# Patient Record
Sex: Male | Born: 1990 | Marital: Single | Smoking: Never smoker
Health system: Southern US, Community
[De-identification: ages and names within clinical notes are randomized; demographics above are authoritative.]

## PROBLEM LIST (undated history)

## (undated) DIAGNOSIS — I471 Supraventricular tachycardia, unspecified: Secondary | ICD-10-CM

## (undated) DIAGNOSIS — R000 Tachycardia, unspecified: Secondary | ICD-10-CM

## (undated) DIAGNOSIS — Q245 Malformation of coronary vessels: Secondary | ICD-10-CM

## (undated) HISTORY — DX: Malformation of coronary vessels: Q24.5

## (undated) HISTORY — PX: NO PAST SURGERIES: SHX2092

---

## 2011-06-28 ENCOUNTER — Ambulatory Visit (INDEPENDENT_AMBULATORY_CARE_PROVIDER_SITE_OTHER): Payer: 59 | Admitting: Family Medicine

## 2011-06-28 DIAGNOSIS — R079 Chest pain, unspecified: Secondary | ICD-10-CM

## 2011-06-28 DIAGNOSIS — M79609 Pain in unspecified limb: Secondary | ICD-10-CM

## 2011-06-28 LAB — POCT CBC
Granulocyte percent: 65.9 %G (ref 37–80)
HCT, POC: 46.4 % (ref 43.5–53.7)
Hemoglobin: 15.4 g/dL (ref 14.1–18.1)
MPV: 9.7 fL (ref 0–99.8)
POC Granulocyte: 4.8 (ref 2–6.9)

## 2011-06-28 MED ORDER — METAXALONE 800 MG PO TABS: ORAL_TABLET | ORAL | Status: DC

## 2011-06-28 MED ORDER — NAPROXEN 500 MG PO TABS: ORAL_TABLET | ORAL | Status: DC

## 2011-06-28 NOTE — Progress Notes (Signed)
  Subjective:    Patient ID: Blake Ortiz, male    DOB: 1990/05/19, 21 y.o.   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  No N/V/D.  He is concerned about his heart.  There is no family history of early cardiac events.  Both of his parents are healthy.  He denies any SOB.  The pain has been constant and worse with movement. He goes to the gym and doesn't have problems with his breathing or pain then.  No recent dental cleaning. Also hurts in L arm.  No paresthesias.  Review of Systems  All other systems reviewed and are negative.      Objective:   Physical Exam  Nursing note and vitals reviewed. Constitutional: He is oriented to person, place, and time. He appears well-developed and well-nourished. No distress.  HENT:  Head: Normocephalic and atraumatic.  Mouth/Throat: Oropharynx is clear and moist. No oropharyngeal exudate.  Neck: Normal range of motion. Neck supple. No thyromegaly present.  Cardiovascular: Normal rate, regular rhythm, normal heart sounds and intact distal pulses.  Exam reveals no gallop and no friction rub.   No murmur heard. Pulmonary/Chest: Effort normal and breath sounds normal. No respiratory distress. He has no wheezes. He has no rales. He exhibits no tenderness.  Abdominal: Soft. Bowel sounds are normal.  Musculoskeletal: Normal range of motion. He exhibits tenderness.       Right shoulder: Normal.       Arms: Lymphadenopathy:    He has no cervical adenopathy.  Neurological: He is alert and oriented to person, place, and time.  Skin: Skin is warm and dry. He is not diaphoretic.  Psychiatric: He has a normal mood and affect. His behavior is normal.   Results for orders placed in visit on 06/28/11  POCT CBC      Component Value Range   WBC 7.3  4.6 - 10.2 (K/uL)   Lymph, poc 2.0  0.6 - 3.4    POC LYMPH PERCENT 26.9  10 - 50 (%L)   MID (cbc) 0.5  0 - 0.9    POC MID % 7.2  0  - 12 (%M)   POC Granulocyte 4.8  2 - 6.9    Granulocyte percent 65.9  37 - 80 (%G)   RBC 5.37  4.69 - 6.13 (M/uL)   Hemoglobin 15.4  14.1 - 18.1 (g/dL)   HCT, POC 40.9  81.1 - 53.7 (%)   MCV 86.4  80 - 97 (fL)   MCH, POC 28.7  27 - 31.2 (pg)   MCHC 33.2  31.8 - 35.4 (g/dL)   RDW, POC 91.4     Platelet Count, POC 250  142 - 424 (K/uL)   MPV 9.7  0 - 99.8 (fL)     EKG: No acute problems.  Read by Dr. Patsy Lager     Assessment & Plan:  Chest pain-musculoskeletal etiology CP warnings to ER

## 2011-06-29 LAB — TSH: TSH: 0.793 u[IU]/mL (ref 0.350–4.500)

## 2016-08-13 ENCOUNTER — Emergency Department (HOSPITAL_COMMUNITY)
Admission: EM | Admit: 2016-08-13 | Discharge: 2016-08-13 | Discharge status: Against Medical Advice | Payer: BLUE CROSS/BLUE SHIELD

## 2019-06-24 ENCOUNTER — Observation Stay (HOSPITAL_COMMUNITY)
Admission: EM | Admit: 2019-06-24 | Discharge: 2019-06-25 | Disposition: A | Discharge status: Home / Self Care | Payer: Managed Care, Other (non HMO) | Attending: Cardiology | Admitting: Cardiology

## 2019-06-24 ENCOUNTER — Observation Stay (HOSPITAL_BASED_OUTPATIENT_CLINIC_OR_DEPARTMENT_OTHER): Payer: Managed Care, Other (non HMO)

## 2019-06-24 ENCOUNTER — Encounter (HOSPITAL_COMMUNITY): Payer: Self-pay | Admitting: *Deleted

## 2019-06-24 ENCOUNTER — Emergency Department (HOSPITAL_COMMUNITY): Payer: Managed Care, Other (non HMO)

## 2019-06-24 ENCOUNTER — Other Ambulatory Visit: Payer: Self-pay

## 2019-06-24 DIAGNOSIS — R7989 Other specified abnormal findings of blood chemistry: Secondary | ICD-10-CM

## 2019-06-24 DIAGNOSIS — R002 Palpitations: Secondary | ICD-10-CM

## 2019-06-24 DIAGNOSIS — Z791 Long term (current) use of non-steroidal anti-inflammatories (NSAID): Secondary | ICD-10-CM | POA: Diagnosis not present

## 2019-06-24 DIAGNOSIS — R000 Tachycardia, unspecified: Secondary | ICD-10-CM | POA: Insufficient documentation

## 2019-06-24 DIAGNOSIS — R778 Other specified abnormalities of plasma proteins: Secondary | ICD-10-CM

## 2019-06-24 DIAGNOSIS — Z7982 Long term (current) use of aspirin: Secondary | ICD-10-CM | POA: Diagnosis not present

## 2019-06-24 DIAGNOSIS — R079 Chest pain, unspecified: Secondary | ICD-10-CM | POA: Diagnosis not present

## 2019-06-24 DIAGNOSIS — R0789 Other chest pain: Secondary | ICD-10-CM | POA: Diagnosis present

## 2019-06-24 DIAGNOSIS — Z79899 Other long term (current) drug therapy: Secondary | ICD-10-CM | POA: Diagnosis not present

## 2019-06-24 DIAGNOSIS — Z20822 Contact with and (suspected) exposure to covid-19: Secondary | ICD-10-CM | POA: Insufficient documentation

## 2019-06-24 HISTORY — DX: Tachycardia, unspecified: R00.0

## 2019-06-24 LAB — ECHOCARDIOGRAM COMPLETE
Height: 66 in
Weight: 2560 oz

## 2019-06-24 LAB — CBC
HCT: 47.3 % (ref 39.0–52.0)
Hemoglobin: 15.6 g/dL (ref 13.0–17.0)
MCH: 27.8 pg (ref 26.0–34.0)
MCHC: 33 g/dL (ref 30.0–36.0)
MCV: 84.3 fL (ref 80.0–100.0)
Platelets: 278 10*3/uL (ref 150–400)
RBC: 5.61 MIL/uL (ref 4.22–5.81)
RDW: 12.4 % (ref 11.5–15.5)
WBC: 13.3 10*3/uL — ABNORMAL HIGH (ref 4.0–10.5)
nRBC: 0 % (ref 0.0–0.2)

## 2019-06-24 LAB — BASIC METABOLIC PANEL
Anion gap: 8 (ref 5–15)
BUN: 19 mg/dL (ref 6–20)
CO2: 25 mmol/L (ref 22–32)
Calcium: 8.8 mg/dL — ABNORMAL LOW (ref 8.9–10.3)
Chloride: 102 mmol/L (ref 98–111)
Creatinine, Ser: 1.14 mg/dL (ref 0.61–1.24)
GFR calc Af Amer: >60 mL/min (ref >60)
GFR calc non Af Amer: >60 mL/min (ref >60)
Glucose, Bld: 113 mg/dL — ABNORMAL HIGH (ref 70–99)
Potassium: 3.7 mmol/L (ref 3.5–5.1)
Sodium: 135 mmol/L (ref 135–145)

## 2019-06-24 LAB — RAPID URINE DRUG SCREEN, HOSP PERFORMED
Amphetamines: NOT DETECTED
Barbiturates: NOT DETECTED
Benzodiazepines: NOT DETECTED
Cocaine: NOT DETECTED
Opiates: NOT DETECTED
Tetrahydrocannabinol: NOT DETECTED

## 2019-06-24 LAB — RESPIRATORY PANEL BY RT PCR (FLU A&B, COVID)
Influenza A by PCR: NEGATIVE
Influenza B by PCR: NEGATIVE
SARS Coronavirus 2 by RT PCR: NEGATIVE

## 2019-06-24 LAB — TROPONIN I (HIGH SENSITIVITY)
Troponin I (High Sensitivity): 349 ng/L (ref <18)
Troponin I (High Sensitivity): 508 ng/L (ref <18)

## 2019-06-24 LAB — HEPARIN LEVEL (UNFRACTIONATED): Heparin Unfractionated: 0.15 IU/mL — ABNORMAL LOW (ref 0.30–0.70)

## 2019-06-24 LAB — D-DIMER, QUANTITATIVE: D-Dimer, Quant: 0.32 ug/mL-FEU (ref 0.00–0.50)

## 2019-06-24 MED ORDER — ONDANSETRON HCL 4 MG/2ML IJ SOLN: 4.0000 mg | Freq: Four times a day (QID) | INTRAMUSCULAR | Status: DC | PRN

## 2019-06-24 MED ORDER — NITROGLYCERIN 0.4 MG SL SUBL: 0.4000 mg | SUBLINGUAL_TABLET | SUBLINGUAL | Status: DC | PRN

## 2019-06-24 MED ORDER — ACETAMINOPHEN 325 MG PO TABS: 650.0000 mg | ORAL_TABLET | ORAL | Status: DC | PRN

## 2019-06-24 MED ORDER — ATORVASTATIN CALCIUM 40 MG PO TABS
40.0000 mg | ORAL_TABLET | Freq: Every day | ORAL | Status: DC
  Administered 2019-06-24 – 2019-06-25 (×2): 40 mg via ORAL
  Filled 2019-06-24 (×2): qty 1

## 2019-06-24 MED ORDER — HEPARIN (PORCINE) 25000 UT/250ML-% IV SOLN
1150.0000 [IU]/h | INTRAVENOUS | Status: DC
  Administered 2019-06-24: 850 [IU]/h via INTRAVENOUS
  Administered 2019-06-25: 08:00:00 1150 [IU]/h via INTRAVENOUS
  Filled 2019-06-24 (×2): qty 250

## 2019-06-24 MED ORDER — ALPRAZOLAM 0.25 MG PO TABS: 0.2500 mg | ORAL_TABLET | Freq: Two times a day (BID) | ORAL | Status: DC | PRN

## 2019-06-24 MED ORDER — ZOLPIDEM TARTRATE 5 MG PO TABS: 5.0000 mg | ORAL_TABLET | Freq: Every evening | ORAL | Status: DC | PRN

## 2019-06-24 MED ORDER — METOPROLOL TARTRATE 25 MG PO TABS
25.0000 mg | ORAL_TABLET | Freq: Two times a day (BID) | ORAL | Status: DC
  Filled 2019-06-24: qty 1

## 2019-06-24 MED ORDER — HEPARIN BOLUS VIA INFUSION
2000.0000 [IU] | Freq: Once | INTRAVENOUS | Status: AC
  Administered 2019-06-24: 2000 [IU] via INTRAVENOUS
  Filled 2019-06-24: qty 2000

## 2019-06-24 MED ORDER — METOPROLOL TARTRATE 50 MG PO TABS
50.0000 mg | ORAL_TABLET | Freq: Two times a day (BID) | ORAL | Status: DC
  Administered 2019-06-24: 50 mg via ORAL
  Filled 2019-06-24 (×2): qty 1

## 2019-06-24 MED ORDER — SODIUM CHLORIDE 0.9% FLUSH
3.0000 mL | Freq: Once | INTRAVENOUS | Status: AC
  Administered 2019-06-24: 11:00:00 3 mL via INTRAVENOUS

## 2019-06-24 MED ORDER — SODIUM CHLORIDE 0.9 % IV SOLN: INTRAVENOUS | Status: DC

## 2019-06-24 MED ORDER — HEPARIN BOLUS VIA INFUSION
4000.0000 [IU] | Freq: Once | INTRAVENOUS | Status: AC
  Administered 2019-06-24: 14:00:00 4000 [IU] via INTRAVENOUS
  Filled 2019-06-24: qty 4000

## 2019-06-24 MED ORDER — ASPIRIN 81 MG PO CHEW
324.0000 mg | CHEWABLE_TABLET | Freq: Once | ORAL | Status: AC
  Administered 2019-06-24: 12:00:00 324 mg via ORAL
  Filled 2019-06-24: qty 4

## 2019-06-24 MED ORDER — ASPIRIN EC 81 MG PO TBEC
81.0000 mg | DELAYED_RELEASE_TABLET | Freq: Every day | ORAL | Status: DC
  Administered 2019-06-25: 11:00:00 81 mg via ORAL
  Filled 2019-06-24: qty 1

## 2019-06-24 NOTE — H&P (Addendum)
Cardiology Admission History and Physical:   Patient ID: [name] MRN: [id number]; DOB: [birth year]   Admission date: 06/24/2019  Primary Care Provider: Patient, No Pcp Per Primary Cardiologist: Rollene Rotunda, MD new  Primary Electrophysiologist:  None   Chief Complaint:  Chest pain  Patient Profile:   Blake Ortiz is a 29 y.o. male with no prior chest pain, no medical issues except for tachycardia that has occurred 2-3 times over last 2 years.    History of Present Illness:   Blake Ortiz with above hx presents with chest pain.  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  He was tired and when he reached down in shower his heart started racing.  It has happened before with bending.  So he rested and it lasted longer than it has in the past.  Usually about an hour and this time it did not resolve.  He finally thinks he dozed but then had significant chest pain.  Described as bad pressure. No radiation and no N or V.  He sat around thinking it would go away, he rubbed his chest with alcohol but no relief.  So came to ER this AM.  His mother is with him.    His HR had slowed prior to arrival. The racing HR usually lasts an hour but this time about 5 hours.  He has never had chest pain with it.  Uses rare ETOH, no tobacco, no drugs.  Job is very active and no chest pain with that.  No lightheadedness no syncope.  No pain currently.  No colds or fevers, no hx of COVID, no vaccine.   EKG:  The ECG that was done today was personally reviewed and demonstrates ST at 112 RA enlargement and no acute changes.  Na 135, K+ 3.7, BUN 19, Cr 1.14  Hs Troponin 349 and 508  WBC 13.3  Hgb 15.6, plts 278  covid neg     2 V CXR  IMPRESSION: Interstitial prominence, question bronchitis symptoms.   Past Medical History:  Diagnosis Date  . Tachycardia    2-3 times over last 2 years    Past Surgical History:  Procedure Laterality Date  . NO PAST SURGERIES        Medications Prior to Admission: Prior to Admission medications   Medication Sig Start Date End Date Taking? Authorizing Provider  metaxalone (SKELAXIN) 800 MG tablet 1 tab tid x 5days then prn muscle pain Patient not taking: Reported on 06/24/2019 06/28/11   Anders Simmonds, PA-C  naproxen (NAPROSYN) 500 MG tablet 1 tab bid X 5 days then prn pain Patient not taking: Reported on 06/24/2019 06/28/11   Anders Simmonds, PA-C     Allergies:   No Known Allergies  Social History:   Social History   Socioeconomic History  . Marital status: Single    Spouse name: Not on file  . Number of children: Not on file  . Years of education: Not on file  . Highest education level: Not on file  Occupational History  . Not on file  Tobacco Use  . Smoking status: Never Smoker  . Smokeless tobacco: Never Used  Substance and Sexual Activity  . Alcohol use: Yes    Alcohol/week: 1.0 standard drinks    Types: 1 Glasses of wine per week  . Drug use: Not on file  . Sexual activity: Not on file  Other Topics Concern  . Not on file  Social History Narrative  .  Not on file   Social Determinants of Health   Financial Resource Strain:   . Difficulty of Paying Living Expenses:   Food Insecurity:   . Worried About Charity fundraiser in the Last Year:   . Arboriculturist in the Last Year:   Transportation Needs:   . Film/video editor (Medical):   Marland Kitchen Lack of Transportation (Non-Medical):   Physical Activity:   . Days of Exercise per Week:   . Minutes of Exercise per Session:   Stress:   . Feeling of Stress :   Social Connections:   . Frequency of Communication with Friends and Family:   . Frequency of Social Gatherings with Friends and Family:   . Attends Religious Services:   . Active Member of Clubs or Organizations:   . Attends Archivist Meetings:   Marland Kitchen Marital Status:   Intimate Partner Violence:   . Fear of Current or Ex-Partner:   . Emotionally Abused:   Marland Kitchen Physically  Abused:   . Sexually Abused:     Family History:   The patient's family history includes Healthy in his father and mother.    ROS:  Please see the history of present illness.  General:no colds or fevers, no weight changes Skin:no rashes or ulcers HEENT:no blurred vision, no congestion CV:see HPI PUL:see HPI GI:no diarrhea constipation or melena, no indigestion GU:no hematuria, no dysuria MS:no joint pain, no claudication Neuro:no syncope, no lightheadedness Endo:no diabetes, no thyroid disease All other ROS reviewed and negative.     Physical Exam/Data:   Vitals:   06/24/19 1430 06/24/19 1445 06/24/19 1500 06/24/19 1515  BP: 105/65 98/63 110/66 103/61  Pulse: 91 83 91 79  Resp: 15 13 (!) 24 19  Temp:      TempSrc:      SpO2: 99% 99% 99% 99%  Weight:      Height:       No intake or output data in the 24 hours ending 06/24/19 1534 Last 3 Weights 06/24/2019 06/28/2011  Weight (lbs) 160 lb 134 lb 6.4 oz  Weight (kg) 72.576 kg 60.963 kg     Body mass index is 25.82 kg/m.  General:  Well nourished, well developed, in no acute distress HEENT: normal Lymph: no adenopathy Neck: no JVD Endocrine:  No thryomegaly Vascular: No carotid bruits; pedal pulses 2+ bilaterally  Cardiac:  normal S1, S2; RRR; no murmur gallup rub or click Lungs:  clear to auscultation bilaterally, no wheezing, rhonchi or rales  Abd: soft, nontender, no hepatomegaly  Ext: no lower ext edema Musculoskeletal:  No deformities, BUE and BLE strength normal and equal Skin: warm and dry  Neuro:  Alert and oriented X 3 MAE follows comands, no focal abnormalities noted Psych:  Normal affect      Relevant CV Studies: Echo pending  Laboratory Data:  High Sensitivity Troponin:   Recent Labs  Lab 06/24/19 0738 06/24/19 1042  TROPONINIHS 349* 508*      Chemistry Recent Labs  Lab 06/24/19 0738  NA 135  K 3.7  CL 102  CO2 25  GLUCOSE 113*  BUN 19  CREATININE 1.14  CALCIUM 8.8*  GFRNONAA >60   GFRAA >60  ANIONGAP 8    No results for input(s): PROT, ALBUMIN, AST, ALT, ALKPHOS, BILITOT in the last 168 hours. Hematology Recent Labs  Lab 06/24/19 0738  WBC 13.3*  RBC 5.61  HGB 15.6  HCT 47.3  MCV 84.3  MCH 27.8  MCHC 33.0  RDW 12.4  PLT 278   BNPNo results for input(s): BNP, PROBNP in the last 168 hours.  DDimer  Recent Labs  Lab 06/24/19 1042  DDIMER 0.32     Radiology/Studies:  DG Chest 2 View  Result Date: 06/24/2019 CLINICAL DATA:  Chest pain EXAM: CHEST - 2 VIEW COMPARISON:  None. FINDINGS: Interstitial coarsening and fissure thickening without definite Kerley line. No effusion or pneumothorax. Normal heart size and mediastinal contours. IMPRESSION: Interstitial prominence, question bronchitis symptoms. Electronically Signed   By: Marnee Spring M.D.   On: 06/24/2019 07:11       HEAR Score (for undifferentiated chest pain):     1  Assessment and Plan:   1. Chest pain with elevated troponin.  Pain occurred after a couple of hours of rapid HR.  He has had heart racing 2-3 times in last 2 years.  Never lasted this long and never with chest pain.   IV heparin was added with elevated troponin.   Echo is being done now - plan to admit to [facility] on tele to further monitor.  Possible cardiac CTA depending on echo.  Dr. Antoine Poche to see.  BB added and statin -he has rec'd ASA 324 mg.  pain resolved 2. Tachycardia - possible SVT - has had 2-3 episodes in last 2 years usually begin when he bends over.  Usually last an hour and resolve.  This time 5-6 hours.  Otherwise it felt the same until the chest pain began.   Severity of Illness: The appropriate patient status for this patient is OBSERVATION. Observation status is judged to be reasonable and necessary in order to provide the required intensity of service to ensure the patient's safety. The patient's presenting symptoms, physical exam findings, and initial radiographic and laboratory data in the context of their  medical condition is felt to place them at decreased risk for further clinical deterioration. Furthermore, it is anticipated that the patient will be medically stable for discharge from the hospital within 2 midnights of admission. The following factors support the patient status of observation.   " The patient's presenting symptoms include chest pain worrisome for angina. " The physical exam findings include none, nor longer tachycardic. " The initial radiographic and laboratory data are abnormal with elevated troponin in healthy 29 year old.      For questions or updates, please contact [facility] Please consult [email or web address] for contact info under    Signed, Nada Boozer, NP  06/24/2019 3:34 PM   History and all data above reviewed.  Patient examined.  I agree with the findings as above.  The patient reports rare episodes of feeling like his heart is racing.  It happens when he bends over usually.  He cannot remember how many times but says that it is not frequent.  He feels something in his body that last for an hour or so and goes away relatively quickly.  It seems to be rapid onset.  He has a Scientist, physiological 3 which he thought might catch these episodes but it was happening so infrequently that he stopped wearing it. He does not have a device that can capture an EKG.  He said the event last night that happened at work and persisted for a while after work was the most intense episode with chest pain.  He did not have presyncope or syncope.  No acute SOB.  By the time he came to the ED it had resolved for the most part but his heart rate  was still elevated somewhat.  There were no acute EKG changes and he had sinus tach.  He did have positive enzymes.  The patient denies any new symptoms such as chest discomfort, neck or arm discomfort. There has been no new shortness of breath, PND or orthopnea. There have been no reported palpitations, presyncope or syncope.  The patient exam reveals COR:RRR  ,   Lungs: Clear  ,  Abd: Positive bowel sounds, no rebound no guarding, Ext No edema  .  All available labs, radiology testing, previous records reviewed. Agree with documented assessment and plan.   CHEST PAIN:  I suspect an arrhythmia.  Preliminary echo looks OK.  I will plan a coronary CTA.  If this is negative could be discharged.  He will get an Apple Watch that can record his EKG.  I would give him beta blocker pill in pocket.  We will teach him vagal maneuvers.  Check TSH.    Fayrene Fearing Artelia Game  4:19 PM  06/24/2019

## 2019-06-24 NOTE — ED Triage Notes (Signed)
Patient arrives reporting that last night before bed he started having palpitations, he has had this before, but usually only lasting about an hour, last night they persisted. Says he fell asleep and woke up with a pain in the center of his chest.

## 2019-06-24 NOTE — ED Provider Notes (Signed)
[facility]-EMERGENCY DEPT Provider Note   CSN: [id number] Arrival date & time: 06/24/19  6962     History Chief Complaint  Patient presents with  . Palpitations    Blake Ortiz is a 29 y.o. male presenting for evaluation of palpitations and chest pain.  Patient states he felt palpitations last night.  Around midnight, he developed a heavy chest pressure, which lasted for several hours before resolving.  During that time, pain was worse with inspiration.  He denies associated nausea, vomiting, diaphoresis.  At this time, patient is chest pain-free.  He reports similar events in the past, but none that have been this severe or lasted this long.  He currently does not have any palpitations.  He denies fevers, chills, cough, abdominal pain, urinary symptoms, abnormal bowel movements, leg pain or swelling.  He denies tobacco or drug use, reports intermittent alcohol use, 9 yesterday.  He has no medical problems, takes medications daily.  He denies history of hypertension, diabetes, family history of early cardiac death.  He denies recent travel, surgeries, immobilization, history of cancer, history of previous DVT/PE, or hormone use.  HPI     History reviewed. No pertinent past medical history.  Patient Active Problem List   Diagnosis Date Noted  . Chest pain at rest 06/24/2019    History reviewed. No pertinent surgical history.     No family history on file.  Social History   Tobacco Use  . Smoking status: Never Smoker  Substance Use Topics  . Alcohol use: Not on file  . Drug use: Not on file    Home Medications Prior to Admission medications   Medication Sig Start Date End Date Taking? Authorizing Provider  metaxalone (SKELAXIN) 800 MG tablet 1 tab tid x 5days then prn muscle pain Patient not taking: Reported on 06/24/2019 06/28/11   Anders Simmonds, PA-C  naproxen (NAPROSYN) 500 MG tablet 1 tab bid X 5 days then prn pain Patient not taking:  Reported on 06/24/2019 06/28/11   Anders Simmonds, PA-C    Allergies    Patient has no known allergies.  Review of Systems   Review of Systems  Cardiovascular: Positive for chest pain and palpitations.  All other systems reviewed and are negative.   Physical Exam Updated Vital Signs BP 117/77   Pulse 92   Temp 99.3 F (37.4 C) (Oral)   Resp 15   Ht 5\' 6"  (1.676 m)   Wt 72.6 kg   SpO2 99%   BMI 25.82 kg/m   Physical Exam Vitals and nursing note reviewed.  Constitutional:      General: He is not in acute distress.    Appearance: He is well-developed.     Comments: Appears nontoxic  HENT:     Head: Normocephalic and atraumatic.  Eyes:     Conjunctiva/sclera: Conjunctivae normal.     Pupils: Pupils are equal, round, and reactive to light.  Cardiovascular:     Rate and Rhythm: Normal rate and regular rhythm.     Pulses: Normal pulses.  Pulmonary:     Effort: Pulmonary effort is normal. No respiratory distress.     Breath sounds: Normal breath sounds. No wheezing.     Comments: Speaking in full sentences.  Clear lung sounds in all fields. Abdominal:     General: There is no distension.     Palpations: Abdomen is soft. There is no mass.     Tenderness: There is no abdominal tenderness. There is no  guarding or rebound.  Musculoskeletal:        General: Normal range of motion.     Cervical back: Normal range of motion and neck supple.     Comments: No leg pain or swelling  Skin:    General: Skin is warm and dry.     Capillary Refill: Capillary refill takes less than 2 seconds.  Neurological:     Mental Status: He is alert and oriented to person, place, and time.     ED Results / Procedures / Treatments   Labs (all labs ordered are listed, but only abnormal results are displayed) Labs Reviewed  BASIC METABOLIC PANEL - Abnormal; Notable for the following components:      Result Value   Glucose, Bld 113 (*)    Calcium 8.8 (*)    All other components within normal  limits  CBC - Abnormal; Notable for the following components:   WBC 13.3 (*)    All other components within normal limits  TROPONIN I (HIGH SENSITIVITY) - Abnormal; Notable for the following components:   Troponin I (High Sensitivity) 349 (*)    All other components within normal limits  TROPONIN I (HIGH SENSITIVITY) - Abnormal; Notable for the following components:   Troponin I (High Sensitivity) 508 (*)    All other components within normal limits  RESPIRATORY PANEL BY RT PCR (FLU A&B, COVID)  D-DIMER, QUANTITATIVE (NOT AT [facility])  RAPID URINE DRUG SCREEN, HOSP PERFORMED    EKG EKG Interpretation  Date/Time:  Wednesday June 24 2019 10:19:02 EDT Ventricular Rate:  94 PR Interval:    QRS Duration: 94 QT Interval:  357 QTC Calculation: 447 R Axis:   33 Text Interpretation: Sinus rhythm Borderline T wave abnormalities No acute changes No significant change since last tracing Confirmed by Varney Biles [phone]) on 06/24/2019 10:28:10 AM   Radiology DG Chest 2 View  Result Date: 06/24/2019 CLINICAL DATA:  Chest pain EXAM: CHEST - 2 VIEW COMPARISON:  None. FINDINGS: Interstitial coarsening and fissure thickening without definite Kerley line. No effusion or pneumothorax. Normal heart size and mediastinal contours. IMPRESSION: Interstitial prominence, question bronchitis symptoms. Electronically Signed   By: Monte Fantasia M.D.   On: 06/24/2019 07:11    Procedures .Critical Care Performed by: Franchot Heidelberg, PA-C Authorized by: Franchot Heidelberg, PA-C   Critical care provider statement:    Critical care time (minutes):  45   Critical care time was exclusive of:  Separately billable procedures and treating other patients and teaching time   Critical care was necessary to treat or prevent imminent or life-threatening deterioration of the following conditions:  Cardiac failure   Critical care was time spent personally by me on the following activities:  Blood draw for specimens,  development of treatment plan with patient or surrogate, discussions with consultants, evaluation of patient's response to treatment, examination of patient, obtaining history from patient or surrogate, ordering and performing treatments and interventions, ordering and review of laboratory studies, ordering and review of radiographic studies, pulse oximetry, re-evaluation of patient's condition and review of old charts   I assumed direction of critical care for this patient from another provider in my specialty: no   Comments:     Pt with elevated trops, concern for injury/ishcmeia requiring admission to the hospital for further evaluation/management   (including critical care time)  Medications Ordered in ED Medications  heparin bolus via infusion 4,000 Units (has no administration in time range)  heparin ADULT infusion 100 units/mL (25000 units/269mL sodium  chloride 0.45%) (has no administration in time range)  sodium chloride flush (NS) 0.9 % injection 3 mL (3 mLs Intravenous Given 06/24/19 1043)  aspirin chewable tablet 324 mg (324 mg Oral Given 06/24/19 1157)    ED Course  I have reviewed the triage vital signs and the nursing notes.  Pertinent labs & imaging results that were available during my care of the patient were reviewed by me and considered in my medical decision making (see chart for details).    MDM Rules/Calculators/A&P                      Patient presenting for evaluation of chest pain and palpitations, both of which have resolved.  On exam, patient appears nontoxic.  He has no risk factors for ACS or PE, however considering his tachycardia and symptoms, labs were obtained from triage including troponin.  Patient's labs were interpreted by me, troponin was elevated in the 300s.  Mild leukocytosis of 13.  Chest x-ray viewed interpreted by me, no pneumonia, no thorax and effusion.  Per radiology, there may be some mild bronchitic changes, though patient is without fever or  cough, low suspicion for acute bronchitis.  EKG with borderline T wave abnormalities, no significant change with repeat in the ED. Case discussed with attending, Dr. Rhunette Croft agrees to plan.   D-dimer negative, as such, doubt PE.  Repeat Trop elevated at 500, delta of 150.  Will consult with cardiology, pt will likely need to be admitted.   Discussed with Dr. Antoine Poche from cardiology who recommends admission to [facility] for cardiac CTA. Requesting UDS to r/o cocaine use.   Final Clinical Impression(s) / ED Diagnoses Final diagnoses:  Elevated troponin  Chest pressure  Palpitations    Rx / DC Orders ED Discharge Orders    None       Alveria Apley, PA-C 06/24/19 1320    Derwood Kaplan, MD 06/24/19 1410

## 2019-06-24 NOTE — Progress Notes (Signed)
ANTICOAGULATION CONSULT NOTE - Initial Consult  Pharmacy Consult for IV heparin Indication: chest pain/ACS  No Known Allergies  Patient Measurements: Height: 5\' 6"  (167.6 cm) Weight: 72.6 kg (160 lb) IBW/kg (Calculated) : 63.8 Heparin Dosing Weight: 72.6 kg   Vital Signs: Temp: 99.3 F (37.4 C) (04/28 0636) Temp Source: Oral (04/28 0636) BP: 109/72 (04/28 1230) Pulse Rate: 104 (04/28 1300)  Labs: Recent Labs    06/24/19 0738 06/24/19 1042  HGB 15.6  --   HCT 47.3  --   PLT 278  --   CREATININE 1.14  --   TROPONINIHS 349* 508*    Estimated Creatinine Clearance: 87.8 mL/min (by C-G formula based on SCr of 1.14 mg/dL).   Medical History: History reviewed. No pertinent past medical history.  Medications:  Scheduled:    Assessment: Pharmacy is consulted to dose heparin in 29 yo male diagnosed with ACS. Pt presented for evaluation of palpitations and chest pain. No noted anticoagulation or antiplatelet meds on med rec list.    Today, 06/24/19  Hgb 15.6, plt 278  SCr 1.13 mg/dl, CrCl > 06/26/19 ml/min   Goal of Therapy:  Heparin level 0.3-0.7 units/ml Monitor platelets by anticoagulation protocol: Yes   Plan:   Heparin 4000 units, then 850 units/hr  Obtain HL 6 hours after start of infusion  Daily CBC and HL while on heparin  Monitor for signs and symptoms of bleeding  785, PharmD, BCPS 06/24/2019 2:09 PM

## 2019-06-24 NOTE — ED Notes (Signed)
Transportation arranged with [facility].  ?

## 2019-06-24 NOTE — Progress Notes (Signed)
Echocardiogram 2D Echocardiogram has been performed.  Blake Ortiz Blake Ortiz 06/24/2019, 3:45 PM

## 2019-06-24 NOTE — ED Notes (Signed)
I sent a urine culture to the main lab 

## 2019-06-25 ENCOUNTER — Observation Stay (HOSPITAL_COMMUNITY): Payer: Managed Care, Other (non HMO)

## 2019-06-25 DIAGNOSIS — R778 Other specified abnormalities of plasma proteins: Secondary | ICD-10-CM

## 2019-06-25 DIAGNOSIS — R079 Chest pain, unspecified: Secondary | ICD-10-CM | POA: Diagnosis not present

## 2019-06-25 LAB — CBC
HCT: 44.1 % (ref 39.0–52.0)
Hemoglobin: 14.7 g/dL (ref 13.0–17.0)
MCH: 28.3 pg (ref 26.0–34.0)
MCHC: 33.3 g/dL (ref 30.0–36.0)
MCV: 84.8 fL (ref 80.0–100.0)
Platelets: 233 10*3/uL (ref 150–400)
RBC: 5.2 MIL/uL (ref 4.22–5.81)
RDW: 12.7 % (ref 11.5–15.5)
WBC: 6.5 10*3/uL (ref 4.0–10.5)
nRBC: 0 % (ref 0.0–0.2)

## 2019-06-25 LAB — T4, FREE: Free T4: 0.91 ng/dL (ref 0.61–1.12)

## 2019-06-25 LAB — BASIC METABOLIC PANEL
Anion gap: 8 (ref 5–15)
BUN: 13 mg/dL (ref 6–20)
CO2: 27 mmol/L (ref 22–32)
Calcium: 8.9 mg/dL (ref 8.9–10.3)
Chloride: 104 mmol/L (ref 98–111)
Creatinine, Ser: 1.05 mg/dL (ref 0.61–1.24)
GFR calc Af Amer: >60 mL/min (ref >60)
GFR calc non Af Amer: >60 mL/min (ref >60)
Glucose, Bld: 102 mg/dL — ABNORMAL HIGH (ref 70–99)
Potassium: 3.6 mmol/L (ref 3.5–5.1)
Sodium: 139 mmol/L (ref 135–145)

## 2019-06-25 LAB — HEPATIC FUNCTION PANEL
ALT: 32 U/L (ref 0–44)
AST: 24 U/L (ref 15–41)
Albumin: 3.3 g/dL — ABNORMAL LOW (ref 3.5–5.0)
Alkaline Phosphatase: 49 U/L (ref 38–126)
Bilirubin, Direct: 0.1 mg/dL (ref 0.0–0.2)
Indirect Bilirubin: 0.6 mg/dL (ref 0.3–0.9)
Total Bilirubin: 0.7 mg/dL (ref 0.3–1.2)
Total Protein: 6.8 g/dL (ref 6.5–8.1)

## 2019-06-25 LAB — HIV ANTIBODY (ROUTINE TESTING W REFLEX): HIV Screen 4th Generation wRfx: NONREACTIVE

## 2019-06-25 LAB — HEPARIN LEVEL (UNFRACTIONATED)
Heparin Unfractionated: 0.28 IU/mL — ABNORMAL LOW (ref 0.30–0.70)
Heparin Unfractionated: <0.1 IU/mL — ABNORMAL LOW (ref 0.30–0.70)

## 2019-06-25 LAB — LIPID PANEL
Cholesterol: 161 mg/dL (ref 0–200)
HDL: 61 mg/dL (ref >40)
LDL Cholesterol: 90 mg/dL (ref 0–99)
Total CHOL/HDL Ratio: 2.6 RATIO
Triglycerides: 52 mg/dL (ref <150)
VLDL: 10 mg/dL (ref 0–40)

## 2019-06-25 LAB — MAGNESIUM: Magnesium: 2.1 mg/dL (ref 1.7–2.4)

## 2019-06-25 LAB — HEMOGLOBIN A1C
Hgb A1c MFr Bld: 5.4 % (ref 4.8–5.6)
Mean Plasma Glucose: 108.28 mg/dL

## 2019-06-25 LAB — TSH: TSH: 0.629 u[IU]/mL (ref 0.350–4.500)

## 2019-06-25 MED ORDER — NITROGLYCERIN 0.4 MG SL SUBL
SUBLINGUAL_TABLET | SUBLINGUAL | Status: AC
  Filled 2019-06-25: qty 2

## 2019-06-25 MED ORDER — METOPROLOL SUCCINATE ER 25 MG PO TB24
25.0000 mg | ORAL_TABLET | Freq: Every day | ORAL | 0 refills | Status: DC
Start: 2019-06-25 — End: 2019-07-17

## 2019-06-25 MED ORDER — IOHEXOL 350 MG/ML SOLN
80.0000 mL | Freq: Once | INTRAVENOUS | Status: AC | PRN
  Administered 2019-06-25: 10:00:00 80 mL via INTRAVENOUS

## 2019-06-25 NOTE — Discharge Summary (Signed)
Discharge Summary    Patient ID: Blake Ortiz,  MRN: [id number], DOB/AGE: 09/21/1990 29 y.o.  Admit date: 06/24/2019 Discharge date: 06/25/2019  Primary Care Provider: Patient, No Pcp Per Primary Cardiologist: Minus Breeding, MD  Discharge Diagnoses    Active Problems:   Chest pain at rest  Allergies No Known Allergies  Diagnostic Studies/Procedures    Echo: 06/24/19  IMPRESSIONS    1. Left ventricular ejection fraction, by estimation, is 65 to 70%. The  left ventricle has normal function. The left ventricle has no regional  wall motion abnormalities. Left ventricular diastolic parameters were  normal.  2. Right ventricular systolic function is normal. The right ventricular  size is normal. Tricuspid regurgitation signal is inadequate for assessing  PA pressure.  3. The mitral valve is normal in structure. No evidence of mitral valve  regurgitation. No evidence of mitral stenosis.  4. The aortic valve is normal in structure. Aortic valve regurgitation is  not visualized. No aortic stenosis is present.  5. The inferior vena cava is normal in size with greater than 50%  respiratory variability, suggesting right atrial pressure of 3 mmHg.   Coronary CT: 06/25/19  FINDINGS: A 100 kV prospective scan was triggered in the descending thoracic aorta at 111 HU's. Axial non-contrast 3 mm slices were carried out through the heart. The data set was analyzed on a dedicated work station and scored using the [place]. Gantry rotation speed was 250 msecs and collimation was .6 mm. Metoprolol 50 mg PO and 0.8 mg of sl NTG was given. The 3D data set was reconstructed in 5% intervals of the 67-82 % of the R-R cycle. Diastolic phases were analyzed on a dedicated work station using MPR, MIP and VRT modes. The patient received 80 cc of contrast. Aorta:  Normal size.  No calcifications.  No dissection. Aortic Valve:  Trileaflet.  No calcifications. Coronary Arteries:   Normal coronary origin.  Left dominance. Left main is a large artery that gives rise to LAD and LCX arteries. Left main has no plaque. LAD is a large vessel that gives rise to a large diagonal artery and wraps around the apex. There is no plaque. 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 D1 has no plaque. LCX is a large dominant artery that gives rise to two OM branches and PDA. There is no plaque. RCA is a medium size non-dominant artery that has no plaque. Other findings:  Normal pulmonary vein drainage into the left atrium.  Normal left atrial appendage without a thrombus.  Normal size of the pulmonary artery.  IMPRESSION: 1. Coronary calcium score of 0. This was 0 percentile for age and sex matched control.  2. Normal coronary origin with right dominance.  3. CAD-RADS 0. No evidence of CAD (0%). 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 Consider therapy with beta-blockers.   Electronically Signed   By: Ena Dawley   On: 06/25/2019 11:34 _____________   History of Present Illness     Blake Ortiz is 29 yp male with no with significant PMH reported. He presented with chest pain.  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  He was tired and when he reached down in shower his heart started racing.  It has happened before with bending.  So he rested and it lasted longer than it had in the past.  Usually about an hour and this time it did not resolve. Described as bad pressure.  No radiation and no N or V.  He sat around thinking it would go away, he rubbed his chest with alcohol but no relief.  So came to ER the morning of admission.   His HR had slowed prior to arrival. The racing HR usually lasted an hour but this time was about 5 hours.  He had never had chest pain with it.  Uses rare ETOH, no tobacco, no drugs.  Job is very active and no chest pain with that.  No lightheadedness no syncope.  No pain  currently.  No colds or fevers, no hx of COVID, no vaccine.  EKG:  The ECG that was done while in the ED was personally reviewed and demonstrates ST at 112 RA enlargement and no acute changes.  Na 135, K+ 3.7, BUN 19, Cr 1.14  Hs Troponin 349 and 508 WBC 13.3  Hgb 15.6, plts 278  covid neg     2 V CXR  IMPRESSION: Interstitial prominence, question bronchitis symptoms.  He was admitted and placed on IV heparin with plans to undergo a coronary CT. BB was started on admission.   Hospital Course     Echocardiogram showed normal EF 70f 65-70% with no rWMA noted, normal RV and no valvular disease. hsTn peaked at 508. Suspect this is secondary to demand ischemia in the setting of tachycardia. Underwent coronary CT noted above with Ca++ score of 0, but did note pLAD with long intramyocardial bridge. He will be continued on BB therapy with Toprol XL 25mg  daily.    BODEN STUCKY was seen by Dr. Traci Sermon and determined stable for discharge home. Follow up in the office has been arranged. Medications are listed below.   _____________  Discharge Vitals Blood pressure 102/64, pulse 81, temperature 98.3 F (36.8 C), temperature source Oral, resp. rate 18, height 5\' 6"  (1.676 m), weight 80.3 kg, SpO2 99 %.  Filed Weights   06/24/19 1254 06/24/19 1613 06/25/19 0335  Weight: 72.6 kg 80.2 kg 80.3 kg    Labs & Radiologic Studies    CBC Recent Labs    06/24/19 0738 06/25/19 0328  WBC 13.3* 6.5  HGB 15.6 14.7  HCT 47.3 44.1  MCV 84.3 84.8  PLT 278 233   Basic Metabolic Panel Recent Labs    06/26/19 0738 06/25/19 0328  NA 135 139  K 3.7 3.6  CL 102 104  CO2 25 27  GLUCOSE 113* 102*  BUN 19 13  CREATININE 1.14 1.05  CALCIUM 8.8* 8.9  MG  --  2.1   Liver Function Tests Recent Labs    06/25/19 0328  AST 24  ALT 32  ALKPHOS 49  BILITOT 0.7  PROT 6.8  ALBUMIN 3.3*   No results for input(s): LIPASE, AMYLASE in the last 72 hours. Cardiac Enzymes No results for input(s):  CKTOTAL, CKMB, CKMBINDEX, TROPONINI in the last 72 hours. BNP Invalid input(s): POCBNP D-Dimer Recent Labs    06/24/19 1042  DDIMER 0.32   Hemoglobin A1C Recent Labs    06/25/19 0328  HGBA1C 5.4   Fasting Lipid Panel Recent Labs    06/25/19 0328  CHOL 161  HDL 61  LDLCALC 90  TRIG 52  CHOLHDL 2.6   Thyroid Function Tests Recent Labs    06/25/19 0328  TSH 0.629   _____________  DG Chest 2 View  Result Date: 06/24/2019 CLINICAL DATA:  Chest pain EXAM: CHEST - 2 VIEW COMPARISON:  None. FINDINGS: Interstitial coarsening and fissure thickening without definite Kerley line.  No effusion or pneumothorax. Normal heart size and mediastinal contours. IMPRESSION: Interstitial prominence, question bronchitis symptoms. Electronically Signed   By: Marnee SpringJonathon  Watts M.D.   On: 06/24/2019 07:11   CT CORONARY MORPH W/CTA COR W/SCORE W/CA W/CM &/OR WO/CM  Addendum Date: 06/25/2019   ADDENDUM REPORT: 06/25/2019 11:34 CLINICAL DATA:  29 year old male with chest pain and elevated troponin. EXAM: Cardiac/Coronary  CTA TECHNIQUE: The patient was scanned on a [employer] scanner. FINDINGS: A 100 kV prospective scan was triggered in the descending thoracic aorta at 111 HU's. Axial non-contrast 3 mm slices were carried out through the heart. The data set was analyzed on a dedicated work station and scored using the [place] method. Gantry rotation speed was 250 msecs and collimation was .6 mm. Metoprolol 50 mg PO and 0.8 mg of sl NTG was given. The 3D data set was reconstructed in 5% intervals of the 67-82 % of the R-R cycle. Diastolic phases were analyzed on a dedicated work station using MPR, MIP and VRT modes. The patient received 80 cc of contrast. Aorta:  Normal size.  No calcifications.  No dissection. Aortic Valve:  Trileaflet.  No calcifications. Coronary Arteries:  Normal coronary origin.  Left dominance. Left main is a large artery that gives rise to LAD and LCX arteries. Left main has no  plaque. LAD is a large vessel that gives rise to a large diagonal artery and wraps around the apex. There is no plaque. 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 D1 has no plaque. LCX is a large dominant artery that gives rise to two OM branches and PDA. There is no plaque. RCA is a medium size non-dominant artery that has no plaque. Other findings: Normal pulmonary vein drainage into the left atrium. Normal left atrial appendage without a thrombus. Normal size of the pulmonary artery. IMPRESSION: 1. Coronary calcium score of 0. This was 0 percentile for age and sex matched control. 2. Normal coronary origin with right dominance. 3. CAD-RADS 0. No evidence of CAD (0%). 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 Consider therapy with beta-blockers. Electronically Signed   By: Tobias AlexanderKatarina  Nelson   On: 06/25/2019 11:34   Result Date: 06/25/2019 EXAM: OVER-READ INTERPRETATION  CT CHEST The following report is an over-read performed by radiologist Dr. Charlett NoseKevin Dover [facility] on 06/25/2019. This over-read does not include interpretation of cardiac or coronary anatomy or pathology. The coronary CTA interpretation by the cardiologist is attached. COMPARISON:  None. FINDINGS: Vascular: Heart is normal size.  Aorta normal caliber. Mediastinum/Nodes: No adenopathy in the lower mediastinum or hila. Lungs/Pleura: No confluent opacities or effusions. Upper Abdomen: Suspect diffuse fatty infiltration of the liver. Musculoskeletal: Chest wall soft tissues are unremarkable. No acute bony abnormality. IMPRESSION: Suspect diffuse fatty infiltration of the liver. Electronically Signed: By: Charlett NoseKevin  Dover M.D. On: 06/25/2019 10:17   ECHOCARDIOGRAM COMPLETE  Result Date: 06/24/2019    ECHOCARDIOGRAM REPORT   Patient Name:   Traci SermonJOSE C Gasiorowski Date of Exam: 06/24/2019 Medical Rec #:  [id number]      Height:       66.0 in Accession #:    [id number]     Weight:       160.0 lb Date of Birth:  [birth year]     BSA:           1.819 m Patient Age:    28 years       BP:           110/66 mmHg Patient  Gender: M              HR:           79 bpm. Exam Location:  Inpatient Procedure: 2D Echo, Color Doppler and Cardiac Doppler Indications:    R07.9* Chest pain, unspecified  History:        Patient has no prior history of Echocardiogram examinations.  Sonographer:    Irving Burton Senior RDCS Referring Phys: [id number] LAURA R INGOLD IMPRESSIONS  1. Left ventricular ejection fraction, by estimation, is 65 to 70%. The left ventricle has normal function. The left ventricle has no regional wall motion abnormalities. Left ventricular diastolic parameters were normal.  2. Right ventricular systolic function is normal. The right ventricular size is normal. Tricuspid regurgitation signal is inadequate for assessing PA pressure.  3. The mitral valve is normal in structure. No evidence of mitral valve regurgitation. No evidence of mitral stenosis.  4. The aortic valve is normal in structure. Aortic valve regurgitation is not visualized. No aortic stenosis is present.  5. The inferior vena cava is normal in size with greater than 50% respiratory variability, suggesting right atrial pressure of 3 mmHg. FINDINGS  Left Ventricle: Left ventricular ejection fraction, by estimation, is 65 to 70%. The left ventricle has normal function. The left ventricle has no regional wall motion abnormalities. The left ventricular internal cavity size was normal in size. There is  no left ventricular hypertrophy. Left ventricular diastolic parameters were normal. Normal left ventricular filling pressure. Right Ventricle: The right ventricular size is normal. No increase in right ventricular wall thickness. Right ventricular systolic function is normal. Tricuspid regurgitation signal is inadequate for assessing PA pressure. Left Atrium: Left atrial size was normal in size. Right Atrium: Right atrial size was normal in size. Pericardium: There is no evidence of pericardial effusion. Mitral  Valve: The mitral valve is normal in structure. Normal mobility of the mitral valve leaflets. No evidence of mitral valve regurgitation. No evidence of mitral valve stenosis. Tricuspid Valve: The tricuspid valve is normal in structure. Tricuspid valve regurgitation is not demonstrated. No evidence of tricuspid stenosis. Aortic Valve: The aortic valve is normal in structure. Aortic valve regurgitation is not visualized. No aortic stenosis is present. Pulmonic Valve: The pulmonic valve was normal in structure. Pulmonic valve regurgitation is not visualized. No evidence of pulmonic stenosis. Aorta: The aortic root is normal in size and structure. Venous: The inferior vena cava is normal in size with greater than 50% respiratory variability, suggesting right atrial pressure of 3 mmHg. IAS/Shunts: No atrial level shunt detected by color flow Doppler.  LEFT VENTRICLE PLAX 2D LVIDd:         4.00 cm  Diastology LVIDs:         2.60 cm  LV e' lateral:   12.80 cm/s LV PW:         0.80 cm  LV E/e' lateral: 6.5 LV IVS:        0.90 cm  LV e' medial:    9.03 cm/s LVOT diam:     1.90 cm  LV E/e' medial:  9.2 LV SV:         66 LV SV Index:   36 LVOT Area:     2.84 cm  RIGHT VENTRICLE RV S prime:     11.70 cm/s TAPSE (M-mode): 2.3 cm LEFT ATRIUM             Index       RIGHT ATRIUM  Index LA diam:        3.00 cm 1.65 cm/m  RA Area:     15.70 cm LA Vol (A2C):   34.9 ml 19.19 ml/m RA Volume:   37.80 ml  20.78 ml/m LA Vol (A4C):   25.3 ml 13.91 ml/m LA Biplane Vol: 29.8 ml 16.38 ml/m  AORTIC VALVE LVOT Vmax:   110.00 cm/s LVOT Vmean:  78.800 cm/s LVOT VTI:    0.233 m  AORTA Ao Root diam: 3.10 cm MITRAL VALVE MV Area (PHT): 3.77 cm    SHUNTS MV Decel Time: 201 msec    Systemic VTI:  0.23 m MV E velocity: 83.10 cm/s  Systemic Diam: 1.90 cm MV A velocity: 70.30 cm/s MV E/A ratio:  1.18 Armanda Magic MD Electronically signed by Armanda Magic MD Signature Date/Time: 06/24/2019/4:04:40 PM    Final    Disposition   Pt is  being discharged home today in good condition.  Follow-up Plans & Appointments    Follow-up Information    Azalee Course, [place] Follow up on 07/06/2019.   Specialties: Cardiology, Radiology Why: at 2:10pm for your follow up appt.  Contact information: [address] [place] [address] [phone]          Discharge Instructions    Diet - low sodium heart healthy   Complete by: As directed    Increase activity slowly   Complete by: As directed      Discharge Medications     Medication List    STOP taking these medications   metaxalone 800 MG tablet Commonly known as: Skelaxin   naproxen 500 MG tablet Commonly known as: Naprosyn     TAKE these medications   metoprolol succinate 25 MG 24 hr tablet Commonly known as: Toprol XL Take 1 tablet (25 mg total) by mouth daily.        No                               Did the patient have a percutaneous coronary intervention (stent / angioplasty)?:  No.      Outstanding Labs/Studies   N/a  Duration of Discharge Encounter   Greater than 30 minutes including physician time.  Signed, Laverda Page NP-C 06/25/2019, 2:03 PM

## 2019-06-25 NOTE — Progress Notes (Signed)
ANTICOAGULATION CONSULT NOTE  Pharmacy Consult for IV heparin Indication: chest pain/ACS  No Known Allergies  Patient Measurements: Height: 5\' 6"  (167.6 cm) Weight: 80.3 kg (177 lb 1.6 oz) IBW/kg (Calculated) : 63.8 Heparin Dosing Weight: 72.6 kg   Vital Signs: Temp: 98.1 F (36.7 C) (04/29 0510) Temp Source: Oral (04/29 0510) BP: 102/65 (04/29 0510) Pulse Rate: 67 (04/29 0510)  Labs: Recent Labs    06/24/19 0738 06/24/19 1042 06/24/19 1937 06/25/19 0328  HGB 15.6  --   --  14.7  HCT 47.3  --   --  44.1  PLT 278  --   --  233  HEPARINUNFRC  --   --  0.15* 0.28*  CREATININE 1.14  --   --   --   TROPONINIHS 349* 508*  --   --     Estimated Creatinine Clearance: 96.9 mL/min (by C-G formula based on SCr of 1.14 mg/dL).   Assessment: Pharmacy is consulted to dose heparin in 29 yo male diagnosed with ACS. Pt presented for evaluation of palpitations and chest pain. No noted anticoagulation or antiplatelet meds on med rec list.    Heparin level 0.28 (slightly subtherapeutic) on gtt at 1000 units/hr. No issues with line or bleeding reported per RN.  Goal of Therapy:  Heparin level 0.3-0.7 units/ml Monitor platelets by anticoagulation protocol: Yes   Plan:   Increase heparin to 1150 units/hr  Will f/u 6 hr heparin level  34, PharmD, BCPS Please see amion for complete clinical pharmacist phone list 06/25/2019 5:18 AM

## 2019-06-25 NOTE — Progress Notes (Signed)
Patient was discharged home by MD order; discharged instructions reviewed and given to patient with care notes; IV DIC; skin intact; patient will be escorted to the car by float nurse via wheelchair.

## 2019-06-25 NOTE — Progress Notes (Signed)
Progress Note  Patient Name: Blake Ortiz Date of Encounter: 06/25/2019  Primary Cardiologist:   Rollene Rotunda, MD   Subjective   No complaints overnight.  No pain or SOB.   Inpatient Medications    Scheduled Meds: . aspirin EC  81 mg Oral Daily  . atorvastatin  40 mg Oral Daily  . metoprolol tartrate  50 mg Oral BID   Continuous Infusions: . sodium chloride 10 mL/hr at 06/24/19 2130  . heparin 1,150 Units/hr (06/25/19 0741)   PRN Meds: acetaminophen, ALPRAZolam, nitroGLYCERIN, ondansetron (ZOFRAN) IV, zolpidem   Vital Signs    Vitals:   06/24/19 2008 06/25/19 0036 06/25/19 0335 06/25/19 0510  BP: (!) 109/59 94/61  102/65  Pulse: 80 71  67  Resp: 19 18  17   Temp: 99.3 F (37.4 C) 98.4 F (36.9 C)  98.1 F (36.7 C)  TempSrc: Oral Oral  Oral  SpO2: 100% 100%  100%  Weight:   80.3 kg   Height:        Intake/Output Summary (Last 24 hours) at 06/25/2019 0807 Last data filed at 06/25/2019 0741 Gross per 24 hour  Intake 653.08 ml  Output 850 ml  Net -196.92 ml   Filed Weights   06/24/19 1254 06/24/19 1613 06/25/19 0335  Weight: 72.6 kg 80.2 kg 80.3 kg    Telemetry    NSR, sinus arrhythmia - Personally Reviewed  ECG    NA - Personally Reviewed  Physical Exam   GEN: No acute distress.   Neck: No  JVD Cardiac: RRR, no murmurs, rubs, or gallops.  Respiratory: Clear  to auscultation bilaterally. GI: Soft, nontender, non-distended  MS: No  edema; No deformity. Neuro:  Nonfocal  Psych: Normal affect   Labs    Chemistry Recent Labs  Lab 06/24/19 0738 06/25/19 0328  NA 135 139  K 3.7 3.6  CL 102 104  CO2 25 27  GLUCOSE 113* 102*  BUN 19 13  CREATININE 1.14 1.05  CALCIUM 8.8* 8.9  PROT  --  6.8  ALBUMIN  --  3.3*  AST  --  24  ALT  --  32  ALKPHOS  --  49  BILITOT  --  0.7  GFRNONAA >60 >60  GFRAA >60 >60  ANIONGAP 8 8     Hematology Recent Labs  Lab 06/24/19 0738 06/25/19 0328  WBC 13.3* 6.5  RBC 5.61 5.20  HGB 15.6 14.7   HCT 47.3 44.1  MCV 84.3 84.8  MCH 27.8 28.3  MCHC 33.0 33.3  RDW 12.4 12.7  PLT 278 233    Cardiac EnzymesNo results for input(s): TROPONINI in the last 168 hours. No results for input(s): TROPIPOC in the last 168 hours.   BNPNo results for input(s): BNP, PROBNP in the last 168 hours.   DDimer  Recent Labs  Lab 06/24/19 1042  DDIMER 0.32     Radiology    DG Chest 2 View  Result Date: 06/24/2019 CLINICAL DATA:  Chest pain EXAM: CHEST - 2 VIEW COMPARISON:  None. FINDINGS: Interstitial coarsening and fissure thickening without definite Kerley line. No effusion or pneumothorax. Normal heart size and mediastinal contours. IMPRESSION: Interstitial prominence, question bronchitis symptoms. Electronically Signed   By: 06/26/2019 M.D.   On: 06/24/2019 07:11   ECHOCARDIOGRAM COMPLETE  Result Date: 06/24/2019    ECHOCARDIOGRAM REPORT   Patient Name:   Blake Ortiz Date of Exam: 06/24/2019 Medical Rec #:  06/26/2019      Height:  66.0 in Accession #:    [id number]     Weight:       160.0 lb Date of Birth:  1990-12-09     BSA:          1.819 m Patient Age:    28 years       BP:           110/66 mmHg Patient Gender: M              HR:           79 bpm. Exam Location:  Inpatient Procedure: 2D Echo, Color Doppler and Cardiac Doppler Indications:    R07.9* Chest pain, unspecified  History:        Patient has no prior history of Echocardiogram examinations.  Sonographer:    Irving Burton Senior RDCS Referring Phys: [id number] LAURA R INGOLD IMPRESSIONS  1. Left ventricular ejection fraction, by estimation, is 65 to 70%. The left ventricle has normal function. The left ventricle has no regional wall motion abnormalities. Left ventricular diastolic parameters were normal.  2. Right ventricular systolic function is normal. The right ventricular size is normal. Tricuspid regurgitation signal is inadequate for assessing PA pressure.  3. The mitral valve is normal in structure. No evidence of mitral valve  regurgitation. No evidence of mitral stenosis.  4. The aortic valve is normal in structure. Aortic valve regurgitation is not visualized. No aortic stenosis is present.  5. The inferior vena cava is normal in size with greater than 50% respiratory variability, suggesting right atrial pressure of 3 mmHg. FINDINGS  Left Ventricle: Left ventricular ejection fraction, by estimation, is 65 to 70%. The left ventricle has normal function. The left ventricle has no regional wall motion abnormalities. The left ventricular internal cavity size was normal in size. There is  no left ventricular hypertrophy. Left ventricular diastolic parameters were normal. Normal left ventricular filling pressure. Right Ventricle: The right ventricular size is normal. No increase in right ventricular wall thickness. Right ventricular systolic function is normal. Tricuspid regurgitation signal is inadequate for assessing PA pressure. Left Atrium: Left atrial size was normal in size. Right Atrium: Right atrial size was normal in size. Pericardium: There is no evidence of pericardial effusion. Mitral Valve: The mitral valve is normal in structure. Normal mobility of the mitral valve leaflets. No evidence of mitral valve regurgitation. No evidence of mitral valve stenosis. Tricuspid Valve: The tricuspid valve is normal in structure. Tricuspid valve regurgitation is not demonstrated. No evidence of tricuspid stenosis. Aortic Valve: The aortic valve is normal in structure. Aortic valve regurgitation is not visualized. No aortic stenosis is present. Pulmonic Valve: The pulmonic valve was normal in structure. Pulmonic valve regurgitation is not visualized. No evidence of pulmonic stenosis. Aorta: The aortic root is normal in size and structure. Venous: The inferior vena cava is normal in size with greater than 50% respiratory variability, suggesting right atrial pressure of 3 mmHg. IAS/Shunts: No atrial level shunt detected by color flow Doppler.   LEFT VENTRICLE PLAX 2D LVIDd:         4.00 cm  Diastology LVIDs:         2.60 cm  LV e' lateral:   12.80 cm/s LV PW:         0.80 cm  LV E/e' lateral: 6.5 LV IVS:        0.90 cm  LV e' medial:    9.03 cm/s LVOT diam:     1.90 cm  LV E/e' medial:  9.2 LV SV:         66 LV SV Index:   36 LVOT Area:     2.84 cm  RIGHT VENTRICLE RV S prime:     11.70 cm/s TAPSE (M-mode): 2.3 cm LEFT ATRIUM             Index       RIGHT ATRIUM           Index LA diam:        3.00 cm 1.65 cm/m  RA Area:     15.70 cm LA Vol (A2C):   34.9 ml 19.19 ml/m RA Volume:   37.80 ml  20.78 ml/m LA Vol (A4C):   25.3 ml 13.91 ml/m LA Biplane Vol: 29.8 ml 16.38 ml/m  AORTIC VALVE LVOT Vmax:   110.00 cm/s LVOT Vmean:  78.800 cm/s LVOT VTI:    0.233 m  AORTA Ao Root diam: 3.10 cm MITRAL VALVE MV Area (PHT): 3.77 cm    SHUNTS MV Decel Time: 201 msec    Systemic VTI:  0.23 m MV E velocity: 83.10 cm/s  Systemic Diam: 1.90 cm MV A velocity: 70.30 cm/s MV E/A ratio:  1.18 Fransico Him MD Electronically signed by Fransico Him MD Signature Date/Time: 06/24/2019/4:04:40 PM    Final     Cardiac Studies   Echo  06/24/19 1. Left ventricular ejection fraction, by estimation, is 65 to 70%. The  left ventricle has normal function. The left ventricle has no regional  wall motion abnormalities. Left ventricular diastolic parameters were  normal.  2. Right ventricular systolic function is normal. The right ventricular  size is normal. Tricuspid regurgitation signal is inadequate for assessing  PA pressure.  3. The mitral valve is normal in structure. No evidence of mitral valve  regurgitation. No evidence of mitral stenosis.  4. The aortic valve is normal in structure. Aortic valve regurgitation is  not visualized. No aortic stenosis is present.  5. The inferior vena cava is normal in size with greater than 50%  respiratory variability, suggesting right atrial pressure of 3 mmHg.   Patient Profile     29 y.o. male with chest pain and  elevated troponin.  No prior cardiac history.   Assessment & Plan    ELEVATED TROPONIN:  Likely secondary to demand ischemia with possible tachycardia although I have no proof of this.   No arrhythmia overnight.  He has ordered an Visual merchandiser.  Home today if CT OK.  Follow up in our office me or APP in two months.    For questions or updates, please contact [facility] Please consult [email or web address] for contact info under Cardiology/STEMI.   Signed, Minus Breeding, MD  06/25/2019, 8:07 AM

## 2019-07-06 ENCOUNTER — Ambulatory Visit: Payer: Managed Care, Other (non HMO) | Admitting: Physician Assistant

## 2019-07-14 NOTE — Progress Notes (Signed)
Cardiology Office Note   Date:  07/17/2019   ID:  JONDAVID SCHREIER, DOB 01-25-1991, MRN [id number]  PCP:  Patient, No Pcp Per Cardiologist:  Rollene Rotunda, MD 06/25/2019 in-hospital Electrphysiologist: None Theodore Demark, PA-C   Chief Complaint  Patient presents with  . Follow-up  . Palpitations    Last week.    History of Present Illness: GILMAN OLAZABAL is a 29 y.o. male with a history of tachypalpitations was admitted 04/28-04/29/2021 with tachypalpitations, chest pain. Trop peak 508, LAD w/ myocardial bridge, but no CAD on CT, echo ok. BB added  DAILEN MCCLISH presents for cardiology follow up.  He had palpitations last Sat, they did not last that long. His HR got to 200, but it only lasted about 30". He quit taking the BB because he was not sure he was supposed to be on it long term.   He does want the short-acting Lopressor, to treat episodes as they occur.   It seems to happen when he bends down. That is true for the last 2 episodes, and maybe more.  Activity level is good, no chest pain, no sx w/ exertion.   The day he had chest pain, his HR had been very high for a long time, he also had a hard/long day at work.    Past Medical History:  Diagnosis Date  . Tachycardia    2-3 times over last 2 years    Past Surgical History:  Procedure Laterality Date  . NO PAST SURGERIES      Current Outpatient Medications  Medication Sig Dispense Refill  . metoprolol succinate (TOPROL XL) 25 MG 24 hr tablet Take 1 tablet (25 mg total) by mouth daily. (Patient not taking: Reported on 07/17/2019) 90 tablet 0   No current facility-administered medications for this visit.    Allergies:   Patient has no known allergies.    Social History:  The patient  reports that he has never smoked. He has never used smokeless tobacco. He reports current alcohol use of about 1.0 standard drinks of alcohol per week.   Family History:  The patient's family history includes Healthy in  his father and mother.  He indicated that his mother is alive. He indicated that his father is alive.    ROS:  Please see the history of present illness. All other systems are reviewed and negative.    PHYSICAL EXAM: VS:  BP 108/62 (BP Location: Right Arm, Patient Position: Sitting, Cuff Size: Normal)   Pulse 64   Temp (!) 97.5 F (36.4 C)   Ht 5' 5.5" (1.664 m)   Wt 183 lb (83 kg)   BMI 29.99 kg/m  , BMI Body mass index is 29.99 kg/m. GEN: Well nourished, well developed, male in no acute distress HEENT: normal for age  Neck: no JVD, no carotid bruit, no masses Cardiac: RRR; no murmur, no rubs, or gallops Respiratory:  clear to auscultation bilaterally, normal work of breathing GI: soft, nontender, nondistended, + BS MS: no deformity or atrophy; no edema; distal pulses are 2+ in all 4 extremities  Skin: warm and dry, no rash Neuro:  Strength and sensation are intact Psych: euthymic mood, full affect   EKG:  EKG is ordered today. The ekg ordered today demonstrates SR, HR 64, nl intervals, no ischemic changes  Echo: 06/24/19 IMPRESSIONS  1. Left ventricular ejection fraction, by estimation, is 65 to 70%. The  left ventricle has normal function. The left ventricle has no regional  wall motion abnormalities. Left ventricular diastolic parameters were  normal.  2. Right ventricular systolic function is normal. The right ventricular  size is normal. Tricuspid regurgitation signal is inadequate for assessing  PA pressure.  3. The mitral valve is normal in structure. No evidence of mitral valve  regurgitation. No evidence of mitral stenosis.  4. The aortic valve is normal in structure. Aortic valve regurgitation is  not visualized. No aortic stenosis is present.  5. The inferior vena cava is normal in size with greater than 50%  respiratory variability, suggesting right atrial pressure of 3 mmHg.   Coronary CT: 06/25/19 FINDINGS: Aorta: Normal size. No  calcifications. No dissection. Aortic Valve: Trileaflet. No calcifications. Coronary Arteries: Normal coronary origin. Left dominance. Left main is a large artery that gives rise to LAD and LCX arteries. Left main has no plaque. LAD is a large vessel that gives rise to a large diagonal artery and wraps around the apex. There is no plaque. 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 D1 has no plaque. LCX is a large dominant artery that gives rise to two OM branches and PDA. There is no plaque. RCA is a medium size non-dominant artery that has no plaque. Other findings:  Normal pulmonary vein drainage into the left atrium.  Normal left atrial appendage without a thrombus.  Normal size of the pulmonary artery.  IMPRESSION: 1. Coronary calcium score of 0. This was 0 percentile for age and sex matched control.  2. Normal coronary origin with right dominance.  3. CAD-RADS 0. No evidence of CAD (0%). 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 Consider therapy with beta-blockers.  Recent Labs: 06/25/2019: ALT 32; BUN 13; Creatinine, Ser 1.05; Hemoglobin 14.7; Magnesium 2.1; Platelets 233; Potassium 3.6; Sodium 139; TSH 0.629  CBC    Component Value Date/Time   WBC 6.5 06/25/2019 0328   RBC 5.20 06/25/2019 0328   HGB 14.7 06/25/2019 0328   HCT 44.1 06/25/2019 0328   PLT 233 06/25/2019 0328   MCV 84.8 06/25/2019 0328   MCV 86.4 06/28/2011 1431   MCH 28.3 06/25/2019 0328   MCHC 33.3 06/25/2019 0328   RDW 12.7 06/25/2019 0328   CMP Latest Ref Rng & Units 06/25/2019 06/24/2019  Glucose 70 - 99 mg/dL 102(H) 113(H)  BUN 6 - 20 mg/dL 13 19  Creatinine 0.61 - 1.24 mg/dL 1.05 1.14  Sodium 135 - 145 mmol/L 139 135  Potassium 3.5 - 5.1 mmol/L 3.6 3.7  Chloride 98 - 111 mmol/L 104 102  CO2 22 - 32 mmol/L 27 25  Calcium 8.9 - 10.3 mg/dL 8.9 8.8(L)  Total Protein 6.5 - 8.1 g/dL 6.8 -  Total Bilirubin 0.3 - 1.2 mg/dL 0.7 -  Alkaline Phos 38 - 126 U/L 49 -  AST 15 -  41 U/L 24 -  ALT 0 - 44 U/L 32 -   Lab Results  Component Value Date   TSH 0.629 06/25/2019   Lipid Panel Lab Results  Component Value Date   CHOL 161 06/25/2019   HDL 61 06/25/2019   LDLCALC 90 06/25/2019   TRIG 52 06/25/2019   CHOLHDL 2.6 06/25/2019      Wt Readings from Last 3 Encounters:  07/17/19 183 lb (83 kg)  06/25/19 177 lb 1.6 oz (80.3 kg)  06/28/11 134 lb 6.4 oz (61 kg) (18 %, Z= -0.91)*   * Growth percentiles are based on CDC (Boys, 2-20 Years) data.     Other studies Reviewed: Additional studies/ records that were  reviewed today include: Office notes, hospital records and testing.  ASSESSMENT AND PLAN:  1.  SVT: -He is requested to restart the Toprol-XL 25 mg and continue to take it until he follows up with Dr. 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  Current medicines are reviewed at length with the patient today.  The patient has concerns regarding medicines.  Concerns were addressed  The following changes have been made: Restart daily beta-blocker and have as needed as well  Labs/ tests ordered today include:  No orders of the defined types were placed in this encounter.    Disposition:   FU with Rollene Rotunda, MD  Signed, Theodore Demark, PA-C  07/17/2019 3:48 PM    [facility] Phone: [phone]; Fax: [phone]

## 2019-07-17 ENCOUNTER — Encounter: Payer: Self-pay | Admitting: Physician Assistant

## 2019-07-17 ENCOUNTER — Other Ambulatory Visit: Payer: Self-pay

## 2019-07-17 ENCOUNTER — Ambulatory Visit: Payer: Managed Care, Other (non HMO) | Admitting: Physician Assistant

## 2019-07-17 VITALS — BP 108/62 | HR 64 | Temp 97.5°F | Ht 65.5 in | Wt 183.0 lb

## 2019-07-17 DIAGNOSIS — I471 Supraventricular tachycardia: Secondary | ICD-10-CM | POA: Diagnosis not present

## 2019-07-17 DIAGNOSIS — Q245 Malformation of coronary vessels: Secondary | ICD-10-CM | POA: Diagnosis not present

## 2019-07-17 MED ORDER — METOPROLOL SUCCINATE ER 25 MG PO TB24: 25.0000 mg | ORAL_TABLET | Freq: Every day | ORAL | 1 refills | Status: DC

## 2019-07-17 MED ORDER — METOPROLOL TARTRATE 25 MG PO TABS: 12.5000 mg | ORAL_TABLET | Freq: Three times a day (TID) | ORAL | 3 refills | Status: DC | PRN

## 2019-07-17 NOTE — Patient Instructions (Signed)
Medication Instructions:   RESTART Metoprolol Succinate (TOPROL XL) 25 mg daily.  START Metoprolol Tartrate (Lopressor) 25 mg---take 1/2-1 tablet three times daily as needed for palpitations.  *If you need a refill on your cardiac medications before your next appointment, please call your pharmacy*   Follow-Up: At [facility], you and your health needs are our priority.  As part of our continuing mission to provide you with exceptional heart care, we have created designated Provider Care Teams.  These Care Teams include your primary Cardiologist (physician) and Advanced Practice Providers (APPs -  Physician Assistants and Nurse Practitioners) who all work together to provide you with the care you need, when you need it.  We recommend signing up for the patient portal called "MyChart".  Sign up information is provided on this After Visit Summary.  MyChart is used to connect with patients for Virtual Visits (Telemedicine).  Patients are able to view lab/test results, encounter notes, upcoming appointments, etc.  Non-urgent messages can be sent to your provider as well.   To learn more about what you can do with MyChart, go to [email or web address].    Your next appointment:   6 month(s)  The format for your next appointment:   In Person  Provider:   You may see Rollene Rotunda, MD or one of the following Advanced Practice Providers on your designated Care Team:    Theodore Demark, PA-C  Joni Reining, DNP, ANP  Cadence Fransico Michael, NP    Other Instructions  It is OK to take antihistamines, but not decongestants.  Please call our office if you experience new or worsening symptoms.

## 2020-11-29 ENCOUNTER — Encounter (HOSPITAL_COMMUNITY): Payer: Self-pay | Admitting: *Deleted

## 2020-11-29 ENCOUNTER — Emergency Department (HOSPITAL_COMMUNITY)
Admission: EM | Admit: 2020-11-29 | Discharge: 2020-11-29 | Disposition: A | Discharge status: Home / Self Care | Payer: Managed Care, Other (non HMO) | Attending: Emergency Medicine | Admitting: Emergency Medicine

## 2020-11-29 DIAGNOSIS — I471 Supraventricular tachycardia: Secondary | ICD-10-CM | POA: Insufficient documentation

## 2020-11-29 DIAGNOSIS — R000 Tachycardia, unspecified: Secondary | ICD-10-CM | POA: Diagnosis present

## 2020-11-29 LAB — CBC WITH DIFFERENTIAL/PLATELET
Abs Immature Granulocytes: 0.04 10*3/uL (ref 0.00–0.07)
Basophils Absolute: 0 10*3/uL (ref 0.0–0.1)
Basophils Relative: 0 %
Eosinophils Absolute: 0.1 10*3/uL (ref 0.0–0.5)
Eosinophils Relative: 1 %
HCT: 45.7 % (ref 39.0–52.0)
Hemoglobin: 15.2 g/dL (ref 13.0–17.0)
Immature Granulocytes: 0 %
Lymphocytes Relative: 25 %
Lymphs Abs: 3.5 10*3/uL (ref 0.7–4.0)
MCH: 28.6 pg (ref 26.0–34.0)
MCHC: 33.3 g/dL (ref 30.0–36.0)
MCV: 85.9 fL (ref 80.0–100.0)
Monocytes Absolute: 0.7 10*3/uL (ref 0.1–1.0)
Monocytes Relative: 5 %
Neutro Abs: 9.6 10*3/uL — ABNORMAL HIGH (ref 1.7–7.7)
Neutrophils Relative %: 69 %
Platelets: 317 10*3/uL (ref 150–400)
RBC: 5.32 MIL/uL (ref 4.22–5.81)
RDW: 12.3 % (ref 11.5–15.5)
WBC: 13.9 10*3/uL — ABNORMAL HIGH (ref 4.0–10.5)
nRBC: 0 % (ref 0.0–0.2)

## 2020-11-29 LAB — BASIC METABOLIC PANEL
Anion gap: 8 (ref 5–15)
BUN: 17 mg/dL (ref 6–20)
CO2: 26 mmol/L (ref 22–32)
Calcium: 8.8 mg/dL — ABNORMAL LOW (ref 8.9–10.3)
Chloride: 102 mmol/L (ref 98–111)
Creatinine, Ser: 1.35 mg/dL — ABNORMAL HIGH (ref 0.61–1.24)
GFR, Estimated: >60 mL/min (ref >60)
Glucose, Bld: 126 mg/dL — ABNORMAL HIGH (ref 70–99)
Potassium: 3.6 mmol/L (ref 3.5–5.1)
Sodium: 136 mmol/L (ref 135–145)

## 2020-11-29 LAB — MAGNESIUM: Magnesium: 1.9 mg/dL (ref 1.7–2.4)

## 2020-11-29 MED ORDER — SODIUM CHLORIDE 0.9 % IV BOLUS
1000.0000 mL | Freq: Once | INTRAVENOUS | Status: AC
  Administered 2020-11-29: 1000 mL via INTRAVENOUS

## 2020-11-29 MED ORDER — ADENOSINE 6 MG/2ML IV SOLN
12.0000 mg | Freq: Once | INTRAVENOUS | Status: AC
  Filled 2020-11-29: qty 4

## 2020-11-29 MED ORDER — ADENOSINE 6 MG/2ML IV SOLN
INTRAVENOUS | Status: AC
  Administered 2020-11-29: 12 mg via INTRAVENOUS
  Filled 2020-11-29: qty 4

## 2020-11-29 MED ORDER — METOPROLOL TARTRATE 25 MG PO TABS: 12.5000 mg | ORAL_TABLET | Freq: Three times a day (TID) | ORAL | 0 refills | Status: DC | PRN

## 2020-11-29 NOTE — ED Triage Notes (Signed)
Pt reports palpitations that started at 7pm, reports soreness in his chest, denies sob. Hx of same

## 2020-11-29 NOTE — ED Provider Notes (Signed)
[facility] EMERGENCY DEPARTMENT Provider Note   CSN: [id number] Arrival date & time: 11/29/20  0023     History Chief Complaint  Patient presents with   Tachycardia    Blake Ortiz is a 30 y.o. male.  The history is provided by the patient and medical records.   30 y.o. M with hx of SVT, presenting to the ED for recurrence of SVT.  Patient states he feels like this started around 7 PM this evening.  States was in usual state of health prior to this.  He did not have any excessive caffeine, energy drinks, or illicit drug use.  Does have history of SVT in the past, last episode was well over a year ago.  He states he is no longer taking his Toprol, took this for several months but did not have any recurrent episodes so stopped taking this.   Has had prior thyroid studies which were normal.  Past Medical History:  Diagnosis Date   Tachycardia    2-3 times over last 2 years    Patient Active Problem List   Diagnosis Date Noted   Chest pain at rest 06/24/2019    Past Surgical History:  Procedure Laterality Date   NO PAST SURGERIES         Family History  Problem Relation Age of Onset   Healthy Mother    Healthy Father     Social History   Tobacco Use   Smoking status: Never   Smokeless tobacco: Never  Substance Use Topics   Alcohol use: Yes    Alcohol/week: 1.0 standard drink    Types: 1 Glasses of wine per week    Home Medications Prior to Admission medications   Medication Sig Start Date End Date Taking? Authorizing Provider  metoprolol succinate (TOPROL XL) 25 MG 24 hr tablet Take 1 tablet (25 mg total) by mouth daily. 07/17/19   Barrett, Joline Salt, PA-C  metoprolol tartrate (LOPRESSOR) 25 MG tablet Take 0.5-1 tablets (12.5-25 mg total) by mouth 3 (three) times daily as needed (Palpitations). 07/17/19 10/15/19  Barrett, Joline Salt, PA-C    Allergies    Patient has no known allergies.  Review of Systems   Review of Systems   Cardiovascular:  Positive for palpitations.  All other systems reviewed and are negative.  Physical Exam Updated Vital Signs BP 104/80   Pulse (!) 119   Temp 97.9 F (36.6 C) (Oral)   Resp (!) 23   SpO2 100%   Physical Exam Vitals and nursing note reviewed.  Constitutional:      Appearance: He is well-developed.  HENT:     Head: Normocephalic and atraumatic.  Eyes:     Conjunctiva/sclera: Conjunctivae normal.     Pupils: Pupils are equal, round, and reactive to light.  Cardiovascular:     Rate and Rhythm: Tachycardia present.     Heart sounds: Normal heart sounds.     Comments: SVT rate 200s Pulmonary:     Effort: Pulmonary effort is normal. No respiratory distress.     Breath sounds: Normal breath sounds. No rhonchi.  Abdominal:     General: Bowel sounds are normal.     Palpations: Abdomen is soft.  Musculoskeletal:        General: Normal range of motion.     Cervical back: Normal range of motion.  Skin:    General: Skin is warm and dry.  Neurological:     Mental Status: He is alert and oriented to  person, place, and time.    ED Results / Procedures / Treatments   Labs (all labs ordered are listed, but only abnormal results are displayed) Labs Reviewed  CBC WITH DIFFERENTIAL/PLATELET - Abnormal; Notable for the following components:      Result Value   WBC 13.9 (*)    Neutro Abs 9.6 (*)    All other components within normal limits  BASIC METABOLIC PANEL - Abnormal; Notable for the following components:   Glucose, Bld 126 (*)    Creatinine, Ser 1.35 (*)    Calcium 8.8 (*)    All other components within normal limits  MAGNESIUM    EKG EKG Interpretation  Date/Time:  Tuesday November 29 2020 00:50:52 EDT Ventricular Rate:  97 PR Interval:  129 QRS Duration: 85 QT Interval:  306 QTC Calculation: 389 R Axis:   41 Text Interpretation: Sinus rhythm Confirmed by Nicanor Alcon, April ([id number]) on 11/29/2020 1:01:26 AM  Radiology No results  found.  Procedures .Cardioversion  Date/Time: 11/29/2020 1:00 AM Performed by: Garlon Hatchet, PA-C Authorized by: Garlon Hatchet, PA-C   Consent:    Consent obtained:  Verbal   Consent given by:  Patient   Risks discussed:  Pain and induced arrhythmia   Alternatives discussed:  Rate-control medication Pre-procedure details:    Cardioversion basis:  Emergent   Rhythm:  Supraventricular tachycardia   Electrode placement:  Anterior-posterior Patient sedated: No Attempt one:    Cardioversion mode attempt one: adenosine 12mg .   Shock outcome:  Conversion to normal sinus rhythm Post-procedure details:    Patient status:  Awake   Patient tolerance of procedure:  Tolerated well, no immediate complications Comments:     Single dose of 12mg  adenosine given with conversion to NSR.  Tolerated well.    CRITICAL CARE Performed by:   Total critical care time: 40 minutes  Critical care time was exclusive of separately billable procedures and treating other patients.  Critical care was necessary to treat or prevent imminent or life-threatening deterioration.  Critical care was time spent personally by me on the following activities: development of treatment plan with patient and/or surrogate as well as nursing, discussions with consultants, evaluation of patient's response to treatment, examination of patient, obtaining history from patient or surrogate, ordering and performing treatments and interventions, ordering and review of laboratory studies, ordering and review of radiographic studies, pulse oximetry and re-evaluation of patient's condition.   Medications Ordered in ED Medications  adenosine (ADENOCARD) 6 MG/2ML injection 12 mg (12 mg Intravenous Given 11/29/20 0049)  sodium chloride 0.9 % bolus 1,000 mL (0 mLs Intravenous Stopped 11/29/20 0133)    ED Course  I have reviewed the triage vital signs and the nursing notes.  Pertinent labs & imaging results that  were available during my care of the patient were reviewed by me and considered in my medical decision making (see chart for details).    MDM Rules/Calculators/A&P                           30 y.o. M here in SVT.  Started around 7PM, no provoking events.  No excessive caffeine, energy drinks, illicit drug use.  Hx of SVT in the past, no longer on lopressor as he felt he did not need it anymore.  On my evaluation, HR 220's consistent with SVT.  Patient mentating appropriately with stable VS.  Given 12mg  adenosine with conversation to NSR.  Patient tolerated well  without complications.  Repeat EKG stable.  Will monitor here.  Awaiting chemistry panel.  2:22 AM Observed here for 1.5 hours post cardioversion and remains in NSR.  Walked around ED on monitoring, no recurrence.  Appears stable for discharge.  His BP is soft here in the ED around 100 systolic, not sure he could tolerate daily BBlocker.  Will have him use PRN metoprolol as he was previously.  Will monitor caffeine intake, energy drinks, etc.  Follow-up with cardiology.  Return here for new concerns.  Final Clinical Impression(s) / ED Diagnoses Final diagnoses:  SVT (supraventricular tachycardia) (HCC)    Rx / DC Orders ED Discharge Orders          Ordered    metoprolol tartrate (LOPRESSOR) 25 MG tablet  3 times daily PRN        11/29/20 0225             Garlon Hatchet, PA-C 11/29/20 0255    Palumbo, April, MD 11/29/20 [phone]

## 2020-11-29 NOTE — ED Notes (Signed)
Pt resting in bed comfortably. Given a warm blanket. Remains in SR. Denies further needs. Denies pain.

## 2020-11-29 NOTE — ED Provider Notes (Signed)
Emergency Medicine Provider Triage Evaluation Note  Blake Ortiz , a 30 y.o. male  was evaluated in triage.  Pt complains of rapid HR.  Onset 6pm.  Denies any pain.  Denies any stimulants  Review of Systems  Positive: Rapid HR Negative: Fever, chills  Physical Exam  BP (!) 81/47 (BP Location: Left Arm)   Pulse (!) 211   Temp 97.9 F (36.6 C) (Oral)   Resp 14   SpO2 100%  Gen:   Awake, no distress   Resp:  Normal effort  MSK:   Moves extremities without difficulty  Other:  Extreme tachycardia  Medical Decision Making  Medically screening exam initiated at 12:37 AM.  Appropriate orders placed.  Blake Ortiz was informed that the remainder of the evaluation will be completed by another provider, this initial triage assessment does not replace that evaluation, and the importance of remaining in the ED until their evaluation is complete.  SVT  Notified charge that patient will need room.   Roxy Horseman, PA-C 11/29/20 [id number]    Nicanor Alcon, April, MD 11/29/20 [id number]

## 2020-11-29 NOTE — ED Notes (Signed)
Pt came in with SVT. Has hx of SVT that was resolved with adenosine. Some shortness of breath due to HR. AxO x4 with a GCS of 15.

## 2020-11-29 NOTE — ED Notes (Signed)
Patient verbalizes understanding of discharge instructions. Prescriptions and follow-up care reviewed. Opportunity for questioning and answers were provided. Armband removed by staff, pt discharged from ED ambulatory.  

## 2020-11-29 NOTE — Discharge Instructions (Signed)
Refill of lopressor for as needed use has been sent to pharmacy.  Keep with you  at all times. Monitor caffeine intake, energy drinks, etc. Follow-up with cardiology. Return here for new/acute changes.

## 2020-11-30 ENCOUNTER — Encounter: Payer: Self-pay | Admitting: Cardiology

## 2020-11-30 DIAGNOSIS — Q245 Malformation of coronary vessels: Secondary | ICD-10-CM | POA: Insufficient documentation

## 2020-11-30 DIAGNOSIS — R000 Tachycardia, unspecified: Secondary | ICD-10-CM | POA: Insufficient documentation

## 2020-11-30 NOTE — Progress Notes (Signed)
Cardiology Office Note   Date:  12/01/2020   ID:  Blake Ortiz, DOB 23-Feb-1991, MRN [id number]  PCP:  Patient, No Pcp Per (Inactive)  Cardiologist:   Rollene Rotunda, MD Referring:  Patient, No Pcp Per (Inactive)  Chief Complaint  Patient presents with   Palpitations       History of Present Illness: Blake Ortiz is a 30 y.o. male who presents for follow up of palpitations.  He was admitted 04/28-04/29/2021 with tachypalpitations.  Trop peaked at 508.  CT demonstrated LAD w/ myocardial bridge, but no CAD.  Echo was essentially unremarkable.    He was in the emergency room a couple of days ago with SVT.  I reviewed these records for this visit.  At that time he had not been taking his metoprolol because he has not had any recurrences.  Stopped it.  There were no apparent triggers.  He was treated with adenosine for narrow complex tachycardia with rate of 220.    He says he has no triggers for this.  He was relaxing just starting dinner when it happened.  He actually went on for 3 hours before he finally had his mom drive him to the emergency room.  He said he had some mild chest pressure with it.  He felt fine once it went away.  Otherwise he is very active.  He does lawn work.  With this he denies any cardiovascular symptoms.  He does not typically have chest pressure, neck or arm discomfort.  He does not have shortness of breath, PND or orthopnea.  He has no weight gain or edema.   Past Medical History:  Diagnosis Date   Coronary-myocardial bridge    Tachycardia     Past Surgical History:  Procedure Laterality Date   NO PAST SURGERIES       Current Outpatient Medications  Medication Sig Dispense Refill   metoprolol tartrate (LOPRESSOR) 25 MG tablet Take 0.5-1 tablets (12.5-25 mg total) by mouth 3 (three) times daily as needed (palpitations). 30 tablet 0   metoprolol succinate (TOPROL XL) 25 MG 24 hr tablet Take 1 tablet (25 mg total) by mouth daily. (Patient not  taking: Reported on 12/01/2020) 90 tablet 1   No current facility-administered medications for this visit.    Allergies:   Patient has no known allergies.    ROS:  Please see the history of present illness.   Otherwise, review of systems are positive for none.   All other systems are reviewed and negative.    PHYSICAL EXAM: VS:  BP 108/64   Pulse 65   Ht 5' 7.2" (1.707 m)   Wt 185 lb 6.4 oz (84.1 kg)   SpO2 99%   BMI 28.87 kg/m  , BMI Body mass index is 28.87 kg/m. GENERAL:  Well appearing NECK:  No jugular venous distention, waveform within normal limits, carotid upstroke brisk and symmetric, no bruits, no thyromegaly LUNGS:  Clear to auscultation bilaterally CHEST:  Unremarkable HEART:  PMI not displaced or sustained,S1 and S2 within normal limits, no S3, no S4, no clicks, no rubs, no murmurs ABD:  Flat, positive bowel sounds normal in frequency in pitch, no bruits, no rebound, no guarding, no midline pulsatile mass, no hepatomegaly, no splenomegaly EXT:  2 plus pulses throughout, no edema, no cyanosis no clubbing   EKG:  EKG is ordered today. The ekg ordered today demonstrates normal sinus rhythm, rate 65, axis within normal limits, intervals within normal limits, early transition lead  normal to, no acute T wave changes.  No acute ischemic changes.   Recent Labs: 11/29/2020: BUN 17; Creatinine, Ser 1.35; Hemoglobin 15.2; Magnesium 1.9; Platelets 317; Potassium 3.6; Sodium 136    Lipid Panel    Component Value Date/Time   CHOL 161 06/25/2019 0328   TRIG 52 06/25/2019 0328   HDL 61 06/25/2019 0328   CHOLHDL 2.6 06/25/2019 0328   VLDL 10 06/25/2019 0328   LDLCALC 90 06/25/2019 0328      Wt Readings from Last 3 Encounters:  12/01/20 185 lb 6.4 oz (84.1 kg)  07/17/19 183 lb (83 kg)  06/25/19 177 lb 1.6 oz (80.3 kg)      Other studies Reviewed: Additional studies/ records that were reviewed today include: ED records. Review of the above records demonstrates:   Please see elsewhere in the note.     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  No change in therapy.  TACHYCARDIA: He has a narrow complex SVT.  He was given metoprolol tartrate to take as needed in the emergency room.  We talked about vagal maneuvers and he was educated.  Ultimately because he has had 2 significant episodes with 1 hospitalization 1 emergency room visits and other less frequent episodes lasting about 20 minutes.  Patient has high when I suggest she is ablation is a primary therapy and I will refer him to EP.  He would like to hear more detail before consenting.  Current medicines are reviewed at length with the patient today.  The patient does not have concerns regarding medicines.  The following changes have been made:  no change  Labs/ tests ordered today include:   Orders Placed This Encounter  Procedures   Ambulatory referral to Cardiac Electrophysiology   EKG 12-Lead      Disposition:   FU wit EP.     Signed, Rollene Rotunda, MD  12/01/2020 2:05 PM    [facility]

## 2020-12-01 ENCOUNTER — Encounter: Payer: Self-pay | Admitting: Cardiology

## 2020-12-01 ENCOUNTER — Other Ambulatory Visit: Payer: Self-pay

## 2020-12-01 ENCOUNTER — Ambulatory Visit: Payer: Managed Care, Other (non HMO) | Admitting: Cardiology

## 2020-12-01 VITALS — BP 108/64 | HR 65 | Ht 67.2 in | Wt 185.4 lb

## 2020-12-01 DIAGNOSIS — R000 Tachycardia, unspecified: Secondary | ICD-10-CM | POA: Diagnosis not present

## 2020-12-01 DIAGNOSIS — Q245 Malformation of coronary vessels: Secondary | ICD-10-CM

## 2020-12-01 NOTE — Patient Instructions (Signed)
Medication Instructions:  Your physician recommends that you continue on your current medications as directed. Please refer to the Current Medication list given to you today.   Labwork: NONE  Testing/Procedures: NONE  Follow-Up: AS NEEDED  You have been referred to ELECTROPHYSIOLOGIST

## 2020-12-06 ENCOUNTER — Other Ambulatory Visit: Payer: Self-pay

## 2020-12-06 ENCOUNTER — Ambulatory Visit: Payer: Managed Care, Other (non HMO) | Admitting: Internal Medicine

## 2020-12-06 ENCOUNTER — Encounter: Payer: Self-pay | Admitting: Internal Medicine

## 2020-12-06 DIAGNOSIS — I471 Supraventricular tachycardia: Secondary | ICD-10-CM | POA: Diagnosis not present

## 2020-12-06 NOTE — Progress Notes (Signed)
      HPI Blake Ortiz is referred by Dr. Antoine Poche for evaluation of SVT. He is a pleasant 30 yo man who is healthy and began experiencing palpitations a couple of hears ago. He has sought medical attention and found to have a narrow, short RP tachycardia at 215/min. His spells stop with adenosine and come on suddenly. They are associated with chest pressure. No syncope. No sob. He has been on a beta blocker but is still having symptoms. Denies ETOH or caffeine abuse.  No Known Allergies   Current Outpatient Medications  Medication Sig Dispense Refill   metoprolol tartrate (LOPRESSOR) 25 MG tablet Take 0.5-1 tablets (12.5-25 mg total) by mouth 3 (three) times daily as needed (palpitations). 30 tablet 0   No current facility-administered medications for this visit.     Past Medical History:  Diagnosis Date   Coronary-myocardial bridge    Tachycardia     ROS:   All systems reviewed and negative except as noted in the HPI.   Past Surgical History:  Procedure Laterality Date   NO PAST SURGERIES       Family History  Problem Relation Age of Onset   Healthy Mother    Healthy Father      Social History   Socioeconomic History   Marital status: Single    Spouse name: Not on file   Number of children: Not on file   Years of education: Not on file   Highest education level: Not on file  Occupational History   Not on file  Tobacco Use   Smoking status: Never   Smokeless tobacco: Never  Substance and Sexual Activity   Alcohol use: Yes    Alcohol/week: 1.0 standard drink    Types: 1 Glasses of wine per week   Drug use: Not on file   Sexual activity: Not on file  Other Topics Concern   Not on file  Social History Narrative   Not on file   Social Determinants of Health   Financial Resource Strain: Not on file  Food Insecurity: Not on file  Transportation Needs: Not on file  Physical Activity: Not on file  Stress: Not on file  Social Connections: Not on file   Intimate Partner Violence: Not on file     BP 110/69   Pulse 64   Ht 5\' 6"  (1.676 m)   Wt 184 lb (83.5 kg)   BMI 29.70 kg/m   Physical Exam:  Well appearing NAD HEENT: Unremarkable Neck:  No JVD, no thyromegally Lymphatics:  No adenopathy Back:  No CVA tenderness Lungs:  Clear with no wheezes HEART:  Regular rate rhythm, no murmurs, no rubs, no clicks Abd:  soft, positive bowel sounds, no organomegally, no rebound, no guarding Ext:  2 plus pulses, no edema, no cyanosis, no clubbing Skin:  No rashes no nodules Neuro:  CN II through XII intact, motor grossly intact  EKG - nsr with no ventricular pre-excitation   Assess/Plan:  SVT - I have discussed the treatment options with the patient. He has had recurrent SVT despite medical therapy and is symptomatic. I have reviewed the indications/risks/benefits/goals/expectations of EPS/RFA and he will call if he wishes to proceed.  [place] Jaeline Whobrey,MD

## 2020-12-06 NOTE — Patient Instructions (Addendum)
Medication Instructions:  Your physician recommends that you continue on your current medications as directed. Please refer to the Current Medication list given to you today.  Labwork: None ordered.  Testing/Procedures: Your physician has recommended that you have an ablation. Catheter ablation is a medical procedure used to treat some cardiac arrhythmias (irregular heartbeats). During catheter ablation, a long, thin, flexible tube is put into a blood vessel in your groin (upper thigh), or neck. This tube is called an ablation catheter. It is then guided to your heart through the blood vessel. Radio frequency waves destroy small areas of heart tissue where abnormal heartbeats may cause an arrhythmia to start. Please see the instruction sheet given to you today.  Follow-Up:  The CPT code for your procedure is:  [phone]  Any Other Special Instructions Will Be Listed Below (If Applicable).  If you need a refill on your cardiac medications before your next appointment, please call your pharmacy.   Cardiac Ablation Cardiac ablation is a procedure to destroy, or ablate, a small amount of heart tissue in very specific places. The heart has many electrical connections. Sometimes these connections are abnormal and can cause the heart to beat very fast or irregularly. Ablating some of the areas that cause problems can improve the heart's rhythm or return it to normal. Ablation may be done for people who: Have Wolff-Parkinson-White syndrome. Have fast heart rhythms (tachycardia). Have taken medicines for an abnormal heart rhythm (arrhythmia) that were not effective or caused side effects. Have a high-risk heartbeat that may be life-threatening. During the procedure, a small incision is made in the neck or the groin, and a long, thin tube (catheter) is inserted into the incision and moved to the heart. Small devices (electrodes) on the tip of the catheter will send out electrical currents. A type of X-ray  (fluoroscopy) will be used to help guide the catheter and to provide images of the heart. Tell a health care provider about: Any allergies you have. All medicines you are taking, including vitamins, herbs, eye drops, creams, and over-the-counter medicines. Any problems you or family members have had with anesthetic medicines. Any blood disorders you have. Any surgeries you have had. Any medical conditions you have, such as kidney failure. Whether you are pregnant or may be pregnant. What are the risks? Generally, this is a safe procedure. However, problems may occur, including: Infection. Bruising and bleeding at the catheter insertion site. Bleeding into the chest, especially into the sac that surrounds the heart. This is a serious complication. Stroke or blood clots. Damage to nearby structures or organs. Allergic reaction to medicines or dyes. Need for a permanent pacemaker if the normal electrical system is damaged. A pacemaker is a small computer that sends electrical signals to the heart and helps your heart beat normally. The procedure not being fully effective. This may not be recognized until months later. Repeat ablation procedures are sometimes done. What happens before the procedure? Medicines Ask your health care provider about: Changing or stopping your regular medicines. This is especially important if you are taking diabetes medicines or blood thinners. Taking medicines such as aspirin and ibuprofen. These medicines can thin your blood. Do not take these medicines unless your health care provider tells you to take them. Taking over-the-counter medicines, vitamins, herbs, and supplements. General instructions Follow instructions from your health care provider about eating or drinking restrictions. Plan to have someone take you home from the hospital or clinic. If you will be going home right after  the procedure, plan to have someone with you for 24 hours. Ask your health  care provider what steps will be taken to prevent infection. What happens during the procedure?  An IV will be inserted into one of your veins. You will be given a medicine to help you relax (sedative). The skin on your neck or groin will be numbed. An incision will be made in your neck or your groin. A needle will be inserted through the incision and into a large vein in your neck or groin. A catheter will be inserted into the needle and moved to your heart. Dye may be injected through the catheter to help your surgeon see the area of the heart that needs treatment. Electrical currents will be sent from the catheter to ablate heart tissue in desired areas. There are three types of energy that may be used to do this: Heat (radiofrequency energy). Laser energy. Extreme cold (cryoablation). When the tissue has been ablated, the catheter will be removed. Pressure will be held on the insertion area to prevent a lot of bleeding. A bandage (dressing) will be placed over the insertion area. The exact procedure may vary among health care providers and hospitals. What happens after the procedure? Your blood pressure, heart rate, breathing rate, and blood oxygen level will be monitored until you leave the hospital or clinic. Your insertion area will be monitored for bleeding. You will need to lie still for a few hours to ensure that you do not bleed from the insertion area. Do not drive for 24 hours or as long as told by your health care provider. Summary Cardiac ablation is a procedure to destroy, or ablate, a small amount of heart tissue using an electrical current. This procedure can improve the heart rhythm or return it to normal. Tell your health care provider about any medical conditions you may have and all medicines you are taking to treat them. This is a safe procedure, but problems may occur. Problems may include infection, bruising, damage to nearby organs or structures, or allergic  reactions to medicines. Follow your health care provider's instructions about eating and drinking before the procedure. You may also be told to change or stop some of your medicines. After the procedure, do not drive for 24 hours or as long as told by your health care provider. This information is not intended to replace advice given to you by your health care provider. Make sure you discuss any questions you have with your health care provider. Document Revised: 12/22/2018 Document Reviewed: 12/22/2018 Elsevier Patient Education  2022 [employer].

## 2021-05-16 IMAGING — CT CT HEART MORP W/ CTA COR W/ SCORE W/ CA W/CM &/OR W/O CM
4 of 7 series · 8 of 20 positions shown, 9 images · IV contrast (APPLIED)
Comparison: None.
COMPARISON: None.

Addendum:
EXAM:
OVER-READ INTERPRETATION  CT CHEST

The following report is an over-read performed by radiologist Dr.
Elvis Eduardo Atenas [[email or web address]] on 06/25/2019. This over-read
does not include interpretation of cardiac or coronary anatomy or
pathology. The coronary CTA interpretation by the cardiologist is
attached.
CLINICAL DATA: 28-year-old male with chest pain and elevated
troponin.
Cardiac/Coronary  CTA
TECHNIQUE: The patient was scanned on a [employer] Force scanner.

[Series 6: best diast 43 % · axial · 0.39mm/px · z∈[-206,-162]mm · 2 of 331 slices shown]
[im 111/331  vessel]
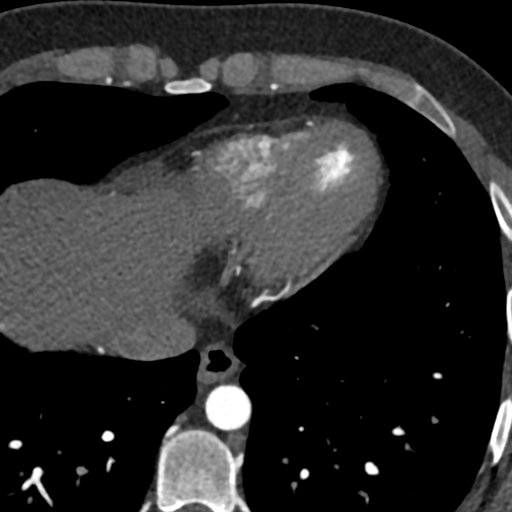
[im 221/331  vessel]
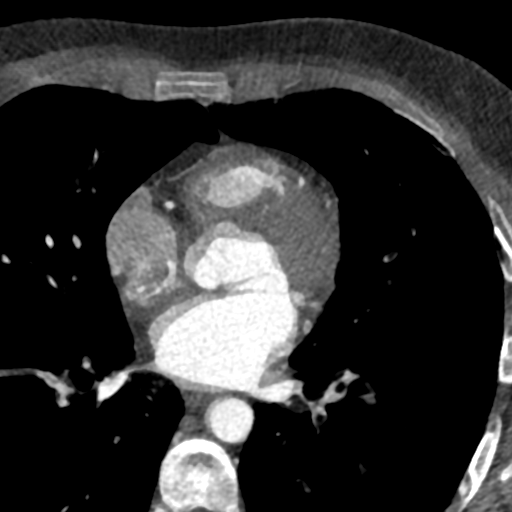

[Series 7: best syst · axial · 0.39mm/px · z∈[-206,-162]mm · 2 of 331 slices shown, 3 images]
[im 111/331  vessel]
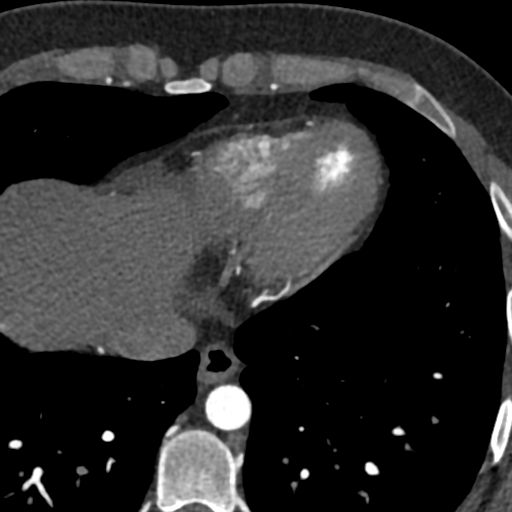
[im 111/331  lung]
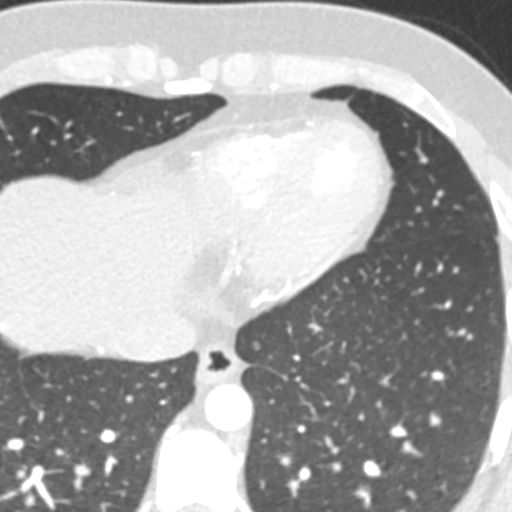
[im 221/331  vessel]
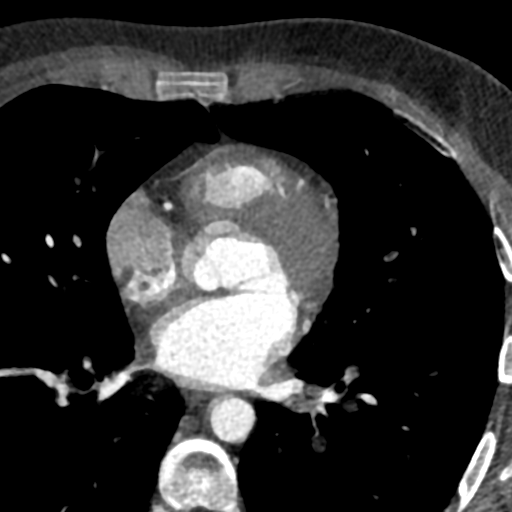

[Series 9: ts diast sharp · axial · 0.39mm/px · z∈[-206,-162]mm · 2 of 331 slices shown]
[im 111/331  lung]
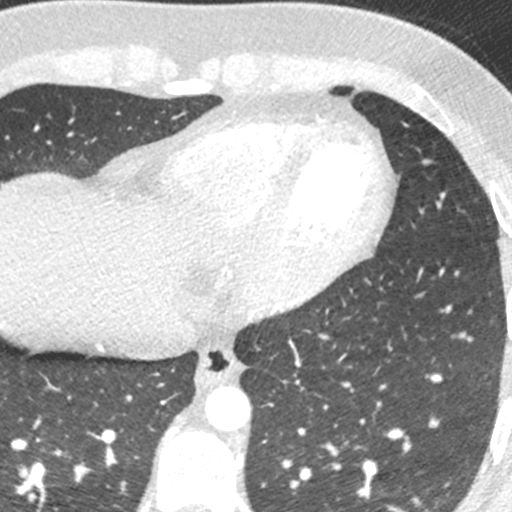
[im 221/331  lung]
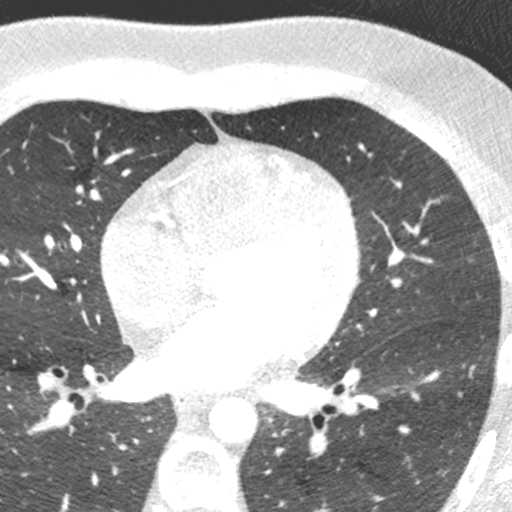

[Series 10: ts syst sharp · axial · 0.39mm/px · z∈[-206,-162]mm · 2 of 331 slices shown]
[im 111/331  lung]
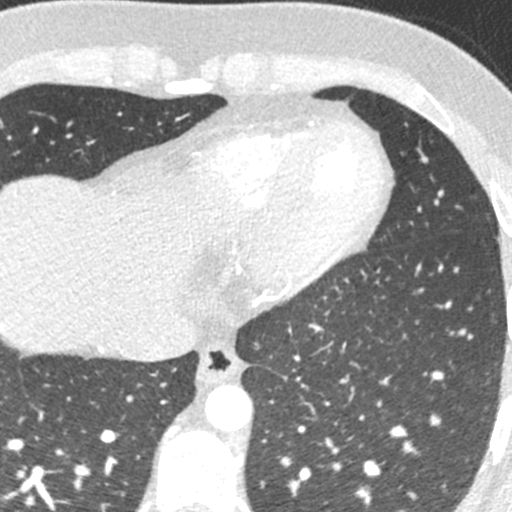
[im 221/331  lung]
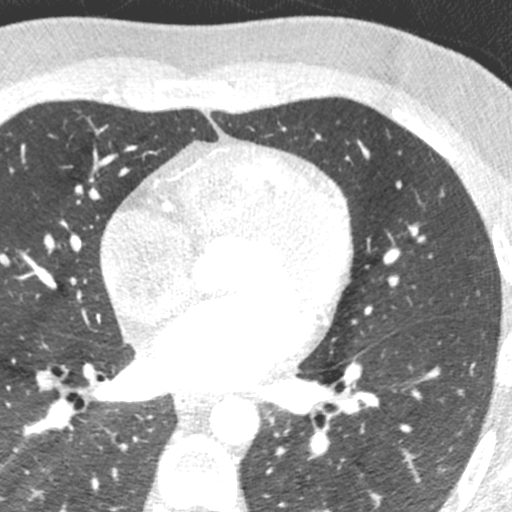

[8 of 20 positions shown; findings below may reference images not displayed]

FINDINGS: Vascular: Heart is normal size.  Aorta normal caliber.

Mediastinum/Nodes: No adenopathy in the lower mediastinum or hila.

Lungs/Pleura: No confluent opacities or effusions.

Upper Abdomen: Suspect diffuse fatty infiltration of the liver.

Musculoskeletal: Chest wall soft tissues are unremarkable. No acute
bony abnormality.
IMPRESSION: Suspect diffuse fatty infiltration of the liver.
FINDINGS: A 100 kV prospective scan was triggered in the descending thoracic
aorta at 111 HU's. Axial non-contrast 3 mm slices were carried out
through the heart. The data set was analyzed on a dedicated work
station and scored using the Agatson method. Gantry rotation speed
was 250 msecs and collimation was .6 mm. Metoprolol 50 mg PO and
mg of sl NTG was given. The 3D data set was reconstructed in 5%
intervals of the 67-82 % of the R-R cycle. Diastolic phases were
analyzed on a dedicated work station using MPR, MIP and VRT modes.
The patient received 80 cc of contrast.

Aorta:  Normal size.  No calcifications.  No dissection.

Aortic Valve:  Trileaflet.  No calcifications.

Coronary Arteries:  Normal coronary origin.  Left dominance.

Left main is a large artery that gives rise to LAD and LCX arteries.
Left main has no plaque.

LAD is a large vessel that gives rise to a large diagonal artery and
wraps around the apex. There is no plaque. Proximal LAD has a long
intramyocardial bridge.

D1 has no plaque.

LCX is a large dominant artery that gives rise to two OM branches
and PDA. There is no plaque.

RCA is a medium size non-dominant artery that has no plaque.

Other findings:

Normal pulmonary vein drainage into the left atrium.

Normal left atrial appendage without a thrombus.

Normal size of the pulmonary artery.
IMPRESSION: 1. Coronary calcium score of 0. This was 0 percentile for age and
sex matched control.

2. Normal coronary origin with right dominance.

3. CAD-RADS 0. No evidence of CAD (0%). Proximal LAD has a long
intramyocardial bridge. Consider therapy with beta-blockers.

*** End of Addendum ***
EXAM:
OVER-READ INTERPRETATION  CT CHEST

The following report is an over-read performed by radiologist Dr.
Elvis Eduardo Atenas [[email or web address]] on 06/25/2019. This over-read
does not include interpretation of cardiac or coronary anatomy or
pathology. The coronary CTA interpretation by the cardiologist is
attached.
FINDINGS: Vascular: Heart is normal size.  Aorta normal caliber.

Mediastinum/Nodes: No adenopathy in the lower mediastinum or hila.

Lungs/Pleura: No confluent opacities or effusions.

Upper Abdomen: Suspect diffuse fatty infiltration of the liver.

Musculoskeletal: Chest wall soft tissues are unremarkable. No acute
bony abnormality.
IMPRESSION: Suspect diffuse fatty infiltration of the liver.

## 2021-09-10 DIAGNOSIS — R000 Tachycardia, unspecified: Secondary | ICD-10-CM | POA: Diagnosis not present

## 2021-09-10 DIAGNOSIS — R944 Abnormal results of kidney function studies: Secondary | ICD-10-CM | POA: Diagnosis not present

## 2021-09-10 DIAGNOSIS — Z91148 Patient's other noncompliance with medication regimen for other reason: Secondary | ICD-10-CM | POA: Diagnosis not present

## 2021-09-10 DIAGNOSIS — R9431 Abnormal electrocardiogram [ECG] [EKG]: Secondary | ICD-10-CM | POA: Diagnosis not present

## 2021-09-10 DIAGNOSIS — D72829 Elevated white blood cell count, unspecified: Secondary | ICD-10-CM | POA: Diagnosis not present

## 2021-09-10 DIAGNOSIS — I471 Supraventricular tachycardia: Secondary | ICD-10-CM | POA: Diagnosis not present

## 2021-09-10 DIAGNOSIS — R002 Palpitations: Secondary | ICD-10-CM | POA: Diagnosis not present

## 2021-10-06 ENCOUNTER — Telehealth: Payer: Self-pay

## 2021-10-06 NOTE — Telephone Encounter (Signed)
Outreach made to Blake Ortiz in response to referral received from [facility] after hospitalization for SVT.  Blake Ortiz is established with Dr. Ladona Ridgel.  Previously discussed SVT ablation with Blake Ortiz.  He was advised to call office if he would like to schedule this ablation.  Spoke with Blake Ortiz.  Advised referral had been received.  Advised Blake Ortiz does not need a referral.  He should call office when he would like to proceed with scheduling SVT ablation.  Blake Ortiz aware.  Blake Ortiz given office phone number.  Denies any further needs at this time.  Await call back to schedule SVT ablation.

## 2021-12-24 ENCOUNTER — Emergency Department (HOSPITAL_COMMUNITY)
Admission: EM | Admit: 2021-12-24 | Discharge: 2021-12-25 | Disposition: A | Discharge status: Home / Self Care | Payer: BC Managed Care – PPO | Attending: Emergency Medicine | Admitting: Emergency Medicine

## 2021-12-24 ENCOUNTER — Other Ambulatory Visit: Payer: Self-pay

## 2021-12-24 DIAGNOSIS — R7989 Other specified abnormal findings of blood chemistry: Secondary | ICD-10-CM | POA: Diagnosis not present

## 2021-12-24 DIAGNOSIS — R002 Palpitations: Secondary | ICD-10-CM | POA: Diagnosis not present

## 2021-12-24 DIAGNOSIS — I471 Supraventricular tachycardia, unspecified: Secondary | ICD-10-CM | POA: Diagnosis not present

## 2021-12-24 DIAGNOSIS — I959 Hypotension, unspecified: Secondary | ICD-10-CM | POA: Diagnosis not present

## 2021-12-24 DIAGNOSIS — E860 Dehydration: Secondary | ICD-10-CM | POA: Diagnosis not present

## 2021-12-24 DIAGNOSIS — I4719 Other supraventricular tachycardia: Secondary | ICD-10-CM | POA: Diagnosis not present

## 2021-12-24 LAB — CBC WITH DIFFERENTIAL/PLATELET
Abs Immature Granulocytes: 0.07 10*3/uL (ref 0.00–0.07)
Basophils Absolute: 0 10*3/uL (ref 0.0–0.1)
Basophils Relative: 0 %
Eosinophils Absolute: 0 10*3/uL (ref 0.0–0.5)
Eosinophils Relative: 0 %
HCT: 47.3 % (ref 39.0–52.0)
Hemoglobin: 16 g/dL (ref 13.0–17.0)
Immature Granulocytes: 1 %
Lymphocytes Relative: 17 %
Lymphs Abs: 2.3 10*3/uL (ref 0.7–4.0)
MCH: 28.6 pg (ref 26.0–34.0)
MCHC: 33.8 g/dL (ref 30.0–36.0)
MCV: 84.5 fL (ref 80.0–100.0)
Monocytes Absolute: 0.7 10*3/uL (ref 0.1–1.0)
Monocytes Relative: 6 %
Neutro Abs: 10.1 10*3/uL — ABNORMAL HIGH (ref 1.7–7.7)
Neutrophils Relative %: 76 %
Platelets: 322 10*3/uL (ref 150–400)
RBC: 5.6 MIL/uL (ref 4.22–5.81)
RDW: 12.5 % (ref 11.5–15.5)
WBC: 13.2 10*3/uL — ABNORMAL HIGH (ref 4.0–10.5)
nRBC: 0 % (ref 0.0–0.2)

## 2021-12-24 LAB — BASIC METABOLIC PANEL
Anion gap: 16 — ABNORMAL HIGH (ref 5–15)
BUN: 22 mg/dL — ABNORMAL HIGH (ref 6–20)
CO2: 19 mmol/L — ABNORMAL LOW (ref 22–32)
Calcium: 9.2 mg/dL (ref 8.9–10.3)
Chloride: 102 mmol/L (ref 98–111)
Creatinine, Ser: 1.97 mg/dL — ABNORMAL HIGH (ref 0.61–1.24)
GFR, Estimated: 46 mL/min — ABNORMAL LOW (ref >60)
Glucose, Bld: 121 mg/dL — ABNORMAL HIGH (ref 70–99)
Potassium: 3.8 mmol/L (ref 3.5–5.1)
Sodium: 137 mmol/L (ref 135–145)

## 2021-12-24 MED ORDER — SODIUM CHLORIDE 0.9 % IV BOLUS
1000.0000 mL | Freq: Once | INTRAVENOUS | Status: AC
  Administered 2021-12-24: 1000 mL via INTRAVENOUS

## 2021-12-24 MED ORDER — ADENOSINE 6 MG/2ML IV SOLN
12.0000 mg | Freq: Once | INTRAVENOUS | Status: AC
  Administered 2021-12-24: 12 mg via INTRAVENOUS
  Filled 2021-12-24: qty 4

## 2021-12-24 MED ORDER — LACTATED RINGERS IV BOLUS
1000.0000 mL | Freq: Once | INTRAVENOUS | Status: AC
  Administered 2021-12-24: 1000 mL via INTRAVENOUS

## 2021-12-24 MED ORDER — LACTATED RINGERS IV BOLUS: 1000.0000 mL | Freq: Once | INTRAVENOUS | Status: DC

## 2021-12-24 MED ORDER — ADENOSINE 6 MG/2ML IV SOLN
6.0000 mg | Freq: Once | INTRAVENOUS | Status: AC
  Administered 2021-12-24: 6 mg via INTRAVENOUS
  Filled 2021-12-24: qty 2

## 2021-12-24 NOTE — ED Notes (Signed)
MD,PA and 2 RN at bedside to give adenosine.  Pt placed on defibrillation pads and connected to 12 lead monitoring  6mg  pushed without response HR remains 203  12 mg pushed with response HR down to 92

## 2021-12-24 NOTE — ED Triage Notes (Signed)
Pt here for sudden onset palpitations tonight. Pt is in SVT in triage w/ rate 203. Pt denies cp/sob. Pt states he has a history of this and has had to receive adenosine in the past, however pt states it sometime happens at home and only lasts 20 min, then goes away.

## 2021-12-24 NOTE — ED Provider Notes (Signed)
[facility] Emergency Department Provider Note MRN:  [id number]  Arrival date & time: 12/25/21     Chief Complaint   Palpitations   History of Present Illness   Blake Ortiz is a 31 y.o. year-old male presents to the ED with chief complaint of palpitations.  He states that he had been out exercising or hiking today.  States that he has had palpitations since 4pm.  He has hx of SVT and takes metoprolol PRN.  He has been cardioverted before with adenosine.  He denies any stimulant use.  He states that he is not having any chest pain or SOB.  History provided by patient.   Review of Systems  Pertinent positive and negative review of systems noted in HPI.    Physical Exam   Vitals:   12/25/21 0100 12/25/21 0130  BP: 107/69 104/67  Pulse: 83 85  Resp: 18 16  Temp:    SpO2: 99% 94%    CONSTITUTIONAL:  anxious-appearing, NAD NEURO:  Alert and oriented x 3, CN 3-12 grossly intact EYES:  eyes equal and reactive ENT/NECK:  Supple, no stridor  CARDIO:  extreme tachycardia PULM:  No respiratory distress, CTAB GI/GU:  non-distended, non tender MSK/SPINE:  No gross deformities, no edema, moves all extremities  SKIN:  no rash, atraumatic   *Additional and/or pertinent findings included in MDM below  Diagnostic and Interventional Summary    EKG Interpretation  Date/Time:  Sunday December 24 2021 22:16:55 EDT Ventricular Rate:  102 PR Interval:  127 QRS Duration: 95 QT Interval:  307 QTC Calculation: 400 R Axis:   49 Text Interpretation: Sinus tachycardia SVT resolved No acute ischemia Confirmed by Sherwood Gambler [phone]) on 12/24/2021 10:28:16 PM       Labs Reviewed  CBC WITH DIFFERENTIAL/PLATELET - Abnormal; Notable for the following components:      Result Value   WBC 13.2 (*)    Neutro Abs 10.1 (*)    All other components within normal limits  BASIC METABOLIC PANEL - Abnormal; Notable for the following components:   CO2 19 (*)     Glucose, Bld 121 (*)    BUN 22 (*)    Creatinine, Ser 1.97 (*)    GFR, Estimated 46 (*)    Anion gap 16 (*)    All other components within normal limits  BASIC METABOLIC PANEL - Abnormal; Notable for the following components:   Glucose, Bld 109 (*)    Creatinine, Ser 1.40 (*)    Calcium 8.4 (*)    All other components within normal limits  TSH    No orders to display    Medications  adenosine (ADENOCARD) 6 MG/2ML injection 6 mg (6 mg Intravenous Given 12/24/21 2215)  adenosine (ADENOCARD) 6 MG/2ML injection 12 mg (12 mg Intravenous Given 12/24/21 2219)  sodium chloride 0.9 % bolus 1,000 mL (0 mLs Intravenous Stopped 12/24/21 2304)  lactated ringers bolus 1,000 mL (0 mLs Intravenous Stopped 12/25/21 0010)  lactated ringers bolus 1,000 mL (0 mLs Intravenous Stopped 12/25/21 0111)     Procedures  /  Critical Care .Critical Care  Performed by: Montine Circle, PA-C Authorized by: Montine Circle, PA-C   Critical care provider statement:    Critical care time (minutes):  55   Critical care was necessary to treat or prevent imminent or life-threatening deterioration of the following conditions:  Circulatory failure   Critical care was time spent personally by me on the following activities:  Development of treatment plan with  patient or surrogate, discussions with consultants, evaluation of patient's response to treatment, examination of patient, ordering and review of laboratory studies, ordering and review of radiographic studies, ordering and performing treatments and interventions, pulse oximetry, re-evaluation of patient's condition and review of old charts .Cardioversion  Date/Time: 12/25/2021 1:45 AM  Performed by: Roxy Horseman, PA-C Authorized by: Roxy Horseman, PA-C   Consent:    Consent obtained:  Verbal   Consent given by:  Patient   Risks discussed:  Induced arrhythmia   Alternatives discussed:  Rate-control medication Pre-procedure details:    Cardioversion  basis:  Emergent   Rhythm:  Supraventricular tachycardia   Electrode placement:  Anterior-posterior Patient sedated: No Attempt one:    Cardioversion mode attempt one: Chemical cardioversion with 6mg  Adenosine.   Shock outcome:  No change in rhythm Attempt two:    Cardioversion mode attempt two: 12mg  Adenosine chemical cardioversion.   Shock outcome:  Conversion to normal sinus rhythm   ED Course and Medical Decision Making  I have reviewed the triage vital signs, the nursing notes, and pertinent available records from the EMR.  Social Determinants Affecting Complexity of Care: Patient has no clinically significant social determinants affecting this chief complaint..   ED Course:    Medical Decision Making Patient brought in with palpitations.  Noted to have heart rate in the low 200s in triage.  Was hypotensive to the 80s systolic.  EKG showed SVT.  Patient was placed on monitor, Zoll, and chemically cardioverted with adenosine.  Labs were notable for elevated creatinine.  He had been hiking all day today.  He was given several liters of fluid and his creatinine began to downtrend.  We will have him recheck this with his doctor.  He is advised to follow-up with the cardiology clinic for his SVT.  He never had any chest pain or shortness of breath, doubt ACS.  Amount and/or Complexity of Data Reviewed Labs: ordered.  Risk Prescription drug management.     Consultants: No consultations were needed in caring for this patient.   Treatment and Plan: I considered admission due to patient's initial presentation, but after considering the examination and diagnostic results, patient will not require admission and can be discharged with outpatient follow-up.  Patient seen by and discussed with attending physician, Dr. , who agrees with plan.  Final Clinical Impressions(s) / ED Diagnoses     ICD-10-CM   1. SVT (supraventricular tachycardia)  I47.10 Ambulatory referral to  Cardiology    2. Dehydration  E86.0     3. Elevated serum creatinine  R79.89       ED Discharge Orders          Ordered    Ambulatory referral to Cardiology       Comments: If you have not heard from the Cardiology office within the next 72 hours please call [phone].   12/25/21 0130              Discharge Instructions Discussed with and Provided to Patient:     Discharge Instructions      Please have you doctor recheck your kidney function in 1-2 weeks.  Please follow-up with the cardiology office.  The office should call you for an appointment.      [phone], PA-C 12/25/21 0150    Roxy Horseman, MD 12/25/21 [phone]

## 2021-12-25 LAB — BASIC METABOLIC PANEL
Anion gap: 8 (ref 5–15)
BUN: 18 mg/dL (ref 6–20)
CO2: 23 mmol/L (ref 22–32)
Calcium: 8.4 mg/dL — ABNORMAL LOW (ref 8.9–10.3)
Chloride: 108 mmol/L (ref 98–111)
Creatinine, Ser: 1.4 mg/dL — ABNORMAL HIGH (ref 0.61–1.24)
GFR, Estimated: >60 mL/min (ref >60)
Glucose, Bld: 109 mg/dL — ABNORMAL HIGH (ref 70–99)
Potassium: 3.7 mmol/L (ref 3.5–5.1)
Sodium: 139 mmol/L (ref 135–145)

## 2021-12-25 LAB — TSH: TSH: 1.116 u[IU]/mL (ref 0.350–4.500)

## 2021-12-25 MED ORDER — LACTATED RINGERS IV BOLUS
1000.0000 mL | Freq: Once | INTRAVENOUS | Status: AC
  Administered 2021-12-25: 1000 mL via INTRAVENOUS

## 2021-12-25 NOTE — Discharge Instructions (Addendum)
Please have you doctor recheck your kidney function in 1-2 weeks.  Please follow-up with the cardiology office.  The office should call you for an appointment.

## 2022-02-06 NOTE — Progress Notes (Unsigned)
Cardiology Office Note Date:  02/08/2022  Patient ID:  Blake [facility], Blake Ortiz [birth year], MRN [id number] PCP:  Patient, No Pcp Per  Cardiologist:  Dr. Antoine Poche Electrophysiologist: Dr. Ladona Ridgel    Chief Complaint: post hospital  History of Present Illness: Blake Ortiz is a 31 y.o. male with history of no CAD by CT (2021 CT demonstrated LAD w/ myocardial bridge, but no CAD), adenosine sensitive SVT.  He saw Dr. Ladona Ridgel 12/06/20, described short RP tachycardia 215bpm, discussed/recommended ablation, the patient was going to give it some thought and reach out of he decided to pursue procedure.  ER visit at [facility] 09/10/21 with SVT (206bpm) treated with adenosine 6mg  by EMS with success.  Discussed vagal maneuvers had failed.  Had run out of his metoprolol a few months earlier.  ER visit [facility] 12/24/21 w/palpitations, had been hiking that day, was in SVT 200's, hypotensive in the 80's > adenosine > SR Given IVF for some dehydration as well K+ 3.8, 3.7 BUN/Creat 22/1.97 > 18/1.40 WBC 13.2 H/H 16/47 Plts 322 TSH 1.116  TODAY He feels well when not in SVT No exertional intolerances No CP, SOB when not in SVT When his SVT starts to last more then an hour or 2 he does feel poorly, weak, heavy in his chest and takes several days to feel like he feels well /normal again.  Historically vagal maneuvers have been successful, but not the last couple times. Not sure if the lopressor really helps, has never taken a 2nd dose.  He feels particularly at night intermittently an unusual awareness of his heart beat, not fast, maybe irregular, or strong beat maybe, but feels different, a bit worrisome to him when he feels them, but no symptoms otherwise  Past Medical History:  Diagnosis Date   Coronary-myocardial bridge    Tachycardia     Past Surgical History:  Procedure Laterality Date   NO PAST SURGERIES      Current Outpatient Medications  Medication Sig Dispense Refill   metoprolol  tartrate (LOPRESSOR) 25 MG tablet Take 0.5-1 tablets (12.5-25 mg total) by mouth 3 (three) times daily as needed (palpitations). 30 tablet 0   No current facility-administered medications for this visit.    Allergies:   Patient has no known allergies.   Social History:  The patient  reports that he has never smoked. He has never used smokeless tobacco. He reports current alcohol use of about 1.0 standard drink of alcohol per week.   Family History:  The patient's family history includes Healthy in his father and mother.  ROS:  Please see the history of present illness.    All other systems are reviewed and otherwise negative.   PHYSICAL EXAM:  VS:  BP 118/72   Pulse 65   Ht 5\' 6"  (1.676 m)   Wt 197 lb (89.4 kg)   SpO2 97%   BMI 31.80 kg/m  BMI: Body mass index is 31.8 kg/m. Well nourished, well developed, in no acute distress HEENT: normocephalic, atraumatic Neck: no JVD, carotid bruits or masses Cardiac:  RRR; no significant murmurs, no rubs, or gallops Lungs:  CTA b/l, no wheezing, rhonchi or rales Abd: soft, nontender MS: no deformity or atrophy Ext:  no edema Skin: warm and dry, no rash Neuro:  No gross deficits appreciated Psych: euthymic mood, full affect    EKG:  not done today ER EKGs are  personally reviewed SVT 205bpm SR 102bpm, no pre-excitation  06/24/19: TTE 1. Left ventricular ejection fraction,  by estimation, is 65 to 70%. The  left ventricle has normal function. The left ventricle has no regional  wall motion abnormalities. Left ventricular diastolic parameters were  normal.   2. Right ventricular systolic function is normal. The right ventricular  size is normal. Tricuspid regurgitation signal is inadequate for assessing  PA pressure.   3. The mitral valve is normal in structure. No evidence of mitral valve  regurgitation. No evidence of mitral stenosis.   4. The aortic valve is normal in structure. Aortic valve regurgitation is  not visualized. No  aortic stenosis is present.   5. The inferior vena cava is normal in size with greater than 50%  respiratory variability, suggesting right atrial pressure of 3 mmHg.    06/25/19: Coronary CT IMPRESSION: 1. Coronary calcium score of 0. This was 0 percentile for age and sex matched control.   2. Normal coronary origin with right dominance.   3. CAD-RADS 0. No evidence of CAD (0%). 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 Consider therapy with beta-blockers.  Recent Labs: 12/24/2021: Hemoglobin 16.0; Platelets 322; TSH 1.116 12/25/2021: BUN 18; Creatinine, Ser 1.40; Potassium 3.7; Sodium 139  No results found for requested labs within last 365 days.   CrCl cannot be calculated (Patient's most recent lab result is older than the maximum 21 days allowed.).   Wt Readings from Last 3 Encounters:  02/08/22 197 lb (89.4 kg)  12/06/20 184 lb (83.5 kg)  12/01/20 185 lb 6.4 oz (84.1 kg)     Other studies reviewed: Additional studies/records reviewed today include: summarized above  ASSESSMENT AND PLAN:  SVT Discussed management strategies, revisit ablation He thinks that he is likely going to want to pursue ablation, but not quite ready yet Discussed daily medication and or continued PRN BB  For now, will maintain on PRN lopressor, discussed how/when to take and continued attempts at vagal maneuver since he has had success in the past on occasion with that And to seek attention if doesn't work   Will get a 2 week monitor to evaluate his occasional fleeting palpitations and f/u on his BMET  as well  Disposition: F/u with Dr. Ladona Ridgel in 2 mo to consider/re-visit ablation   Current medicines are reviewed at length with the patient today.  The patient did not have any concerns regarding medicines.  Norma Fredrickson, PA-C 02/08/2022 8:37 AM     [facility] [address] [place] [address] [phone] (office)  [phone] (fax)

## 2022-02-08 ENCOUNTER — Ambulatory Visit (INDEPENDENT_AMBULATORY_CARE_PROVIDER_SITE_OTHER): Payer: BC Managed Care – PPO

## 2022-02-08 ENCOUNTER — Encounter: Payer: Self-pay | Admitting: Physician Assistant

## 2022-02-08 ENCOUNTER — Ambulatory Visit: Payer: BC Managed Care – PPO | Attending: Student | Admitting: Physician Assistant

## 2022-02-08 VITALS — BP 118/72 | HR 65 | Ht 66.0 in | Wt 197.0 lb

## 2022-02-08 DIAGNOSIS — I471 Supraventricular tachycardia, unspecified: Secondary | ICD-10-CM

## 2022-02-08 DIAGNOSIS — R002 Palpitations: Secondary | ICD-10-CM | POA: Diagnosis not present

## 2022-02-08 LAB — BASIC METABOLIC PANEL
BUN/Creatinine Ratio: 14 (ref 9–20)
BUN: 14 mg/dL (ref 6–20)
CO2: 23 mmol/L (ref 20–29)
Calcium: 9.3 mg/dL (ref 8.7–10.2)
Chloride: 103 mmol/L (ref 96–106)
Creatinine, Ser: 0.99 mg/dL (ref 0.76–1.27)
Glucose: 89 mg/dL (ref 70–99)
Potassium: 4.1 mmol/L (ref 3.5–5.2)
Sodium: 141 mmol/L (ref 134–144)
eGFR: 105 mL/min/{1.73_m2} (ref >59)

## 2022-02-08 MED ORDER — METOPROLOL TARTRATE 25 MG PO TABS: 12.5000 mg | ORAL_TABLET | Freq: Three times a day (TID) | ORAL | 0 refills | Status: DC | PRN

## 2022-02-08 NOTE — Progress Notes (Unsigned)
Enrolled for [employer] to mail a ZIO XT long term holter monitor to the patients address on file.   Dr. Hochrein to read. 

## 2022-02-08 NOTE — Patient Instructions (Addendum)
Medication Instructions:   Your physician recommends that you continue on your current medications as directed. Please refer to the Current Medication list given to you today.   *If you need a refill on your cardiac medications before your next appointment, please call your pharmacy*   Lab Work:  BMET  TODAY    If you have labs (blood work) drawn today and your tests are completely normal, you will receive your results only by: MyChart Message (if you have MyChart) OR A paper copy in the mail If you have any lab test that is abnormal or we need to change your treatment, we will call you to review the results.   Testing/Procedures: Your physician has recommended that you wear an event monitor. Event monitors are medical devices that record the heart's electrical activity. Doctors most often [place] these monitors to diagnose arrhythmias. Arrhythmias are problems with the speed or rhythm of the heartbeat. The monitor is a small, portable device. You can wear one while you do your normal daily activities. This is usually used to diagnose what is causing palpitations/syncope (passing out).     Follow-Up: At [facility], you and your health needs are our priority.  As part of our continuing mission to provide you with exceptional heart care, we have created designated Provider Care Teams.  These Care Teams include your primary Cardiologist (physician) and Advanced Practice Providers (APPs -  Physician Assistants and Nurse Practitioners) who all work together to provide you with the care you need, when you need it.  We recommend signing up for the patient portal called "MyChart".  Sign up information is provided on this After Visit Summary.  MyChart is used to connect with patients for Virtual Visits (Telemedicine).  Patients are able to view lab/test results, encounter notes, upcoming appointments, etc.  Non-urgent messages can be sent to your provider as well.   To learn more about what you  can do with MyChart, go to [email or web address].    Your next appointment:   2 month(s) ( CONTACT [facility] FOR EP SCHEDULING ISSUES )   The format for your next appointment:   In Person  Provider:   Lewayne Bunting, MD    Other Instructions  ZIO XT- Long Term Monitor Instructions  Your physician has requested you wear a ZIO patch monitor for 14 days.  This is a single patch monitor. [employer] supplies one patch monitor per enrollment. Additional stickers are not available. Please do not apply patch if you will be having a Nuclear Stress Test,  Echocardiogram, Cardiac CT, MRI, or Chest Xray during the period you would be wearing the  monitor. The patch cannot be worn during these tests. You cannot remove and re-apply the  ZIO XT patch monitor.  Your ZIO patch monitor will be mailed 3 day USPS to your address on file. It may take 3-5 days  to receive your monitor after you have been enrolled.  Once you have received your monitor, please review the enclosed instructions. Your monitor  has already been registered assigning a specific monitor serial # to you.  Billing and Patient Assistance Program Information  We have supplied [employer] with any of your insurance information on file for billing purposes. [employer] offers a sliding scale Patient Assistance Program for patients that do not have  insurance, or whose insurance does not completely cover the cost of the ZIO monitor.  You must apply for the Patient Assistance Program to qualify for this discounted rate.  To apply, please  call [employer] at [phone], select option 4, select option 2, ask to apply for  Patient Assistance Program. Meredeth Ide will ask your household income, and how many people  are in your household. They will quote your out-of-pocket cost based on that information.  [employer] will also be able to set up a 69-month, interest-free payment plan if needed.  Applying the monitor   Shave hair from upper left chest.  Hold  abrader disc by orange tab. Rub abrader in 40 strokes over the upper left chest as  indicated in your monitor instructions.  Clean area with 4 enclosed alcohol pads. Let dry.  Apply patch as indicated in monitor instructions. Patch will be placed under collarbone on left  side of chest with arrow pointing upward.  Rub patch adhesive wings for 2 minutes. Remove white label marked "1". Remove the white  label marked "2". Rub patch adhesive wings for 2 additional minutes.  While looking in a mirror, press and release button in center of patch. A small green light will  flash 3-4 times. This will be your only indicator that the monitor has been turned on.  Do not shower for the first 24 hours. You may shower after the first 24 hours.  Press the button if you feel a symptom. You will hear a small click. Record Date, Time and  Symptom in the Patient Logbook.  When you are ready to remove the patch, follow instructions on the last 2 pages of Patient  Logbook. Stick patch monitor onto the last page of Patient Logbook.  Place Patient Logbook in the blue and white box. Use locking tab on box and tape box closed  securely. The blue and white box has prepaid postage on it. Please place it in the mailbox as  soon as possible. Your physician should have your test results approximately 7 days after the  monitor has been mailed back to [facility].  Call [facility] Customer Care at [phone] if you have questions regarding  your ZIO XT patch monitor. Call them immediately if you see an orange light blinking on your  monitor.  If your monitor falls off in less than 4 days, contact our Monitor department at [phone].  If your monitor becomes loose or falls off after 4 days call [employer] at [phone] for  suggestions on securing your monitor  Important Information About Sugar

## 2022-02-14 ENCOUNTER — Ambulatory Visit: Payer: BC Managed Care – PPO | Admitting: Student

## 2022-02-14 DIAGNOSIS — I471 Supraventricular tachycardia, unspecified: Secondary | ICD-10-CM | POA: Diagnosis not present

## 2022-02-14 DIAGNOSIS — R002 Palpitations: Secondary | ICD-10-CM

## 2022-03-08 ENCOUNTER — Other Ambulatory Visit: Payer: Self-pay | Admitting: Physician Assistant

## 2022-03-19 DIAGNOSIS — R002 Palpitations: Secondary | ICD-10-CM | POA: Diagnosis not present

## 2022-03-19 DIAGNOSIS — I471 Supraventricular tachycardia, unspecified: Secondary | ICD-10-CM | POA: Diagnosis not present

## 2022-04-12 ENCOUNTER — Ambulatory Visit: Payer: BC Managed Care – PPO | Admitting: Internal Medicine

## 2022-10-28 ENCOUNTER — Encounter (HOSPITAL_COMMUNITY): Payer: Self-pay

## 2022-10-28 ENCOUNTER — Emergency Department (HOSPITAL_COMMUNITY): Payer: BC Managed Care – PPO

## 2022-10-28 ENCOUNTER — Emergency Department (HOSPITAL_COMMUNITY)
Admission: EM | Admit: 2022-10-28 | Discharge: 2022-10-29 | Disposition: A | Discharge status: Home / Self Care | Payer: BC Managed Care – PPO | Attending: Emergency Medicine | Admitting: Emergency Medicine

## 2022-10-28 DIAGNOSIS — D72829 Elevated white blood cell count, unspecified: Secondary | ICD-10-CM | POA: Insufficient documentation

## 2022-10-28 DIAGNOSIS — J189 Pneumonia, unspecified organism: Secondary | ICD-10-CM

## 2022-10-28 DIAGNOSIS — R918 Other nonspecific abnormal finding of lung field: Secondary | ICD-10-CM | POA: Diagnosis not present

## 2022-10-28 DIAGNOSIS — R002 Palpitations: Secondary | ICD-10-CM | POA: Diagnosis not present

## 2022-10-28 DIAGNOSIS — J168 Pneumonia due to other specified infectious organisms: Secondary | ICD-10-CM | POA: Diagnosis not present

## 2022-10-28 DIAGNOSIS — J181 Lobar pneumonia, unspecified organism: Secondary | ICD-10-CM | POA: Insufficient documentation

## 2022-10-28 DIAGNOSIS — J984 Other disorders of lung: Secondary | ICD-10-CM | POA: Diagnosis not present

## 2022-10-28 DIAGNOSIS — Z79899 Other long term (current) drug therapy: Secondary | ICD-10-CM | POA: Diagnosis not present

## 2022-10-28 DIAGNOSIS — I471 Supraventricular tachycardia, unspecified: Secondary | ICD-10-CM | POA: Insufficient documentation

## 2022-10-28 HISTORY — DX: Supraventricular tachycardia, unspecified: I47.10

## 2022-10-28 LAB — BASIC METABOLIC PANEL
Anion gap: 14 (ref 5–15)
BUN: 16 mg/dL (ref 6–20)
CO2: 17 mmol/L — ABNORMAL LOW (ref 22–32)
Calcium: 8.1 mg/dL — ABNORMAL LOW (ref 8.9–10.3)
Chloride: 103 mmol/L (ref 98–111)
Creatinine, Ser: 1.18 mg/dL (ref 0.61–1.24)
GFR, Estimated: >60 mL/min (ref >60)
Glucose, Bld: 134 mg/dL — ABNORMAL HIGH (ref 70–99)
Potassium: 3.9 mmol/L (ref 3.5–5.1)
Sodium: 134 mmol/L — ABNORMAL LOW (ref 135–145)

## 2022-10-28 LAB — CBC
HCT: 45.4 % (ref 39.0–52.0)
Hemoglobin: 15.3 g/dL (ref 13.0–17.0)
MCH: 27.8 pg (ref 26.0–34.0)
MCHC: 33.7 g/dL (ref 30.0–36.0)
MCV: 82.5 fL (ref 80.0–100.0)
Platelets: 320 10*3/uL (ref 150–400)
RBC: 5.5 MIL/uL (ref 4.22–5.81)
RDW: 13 % (ref 11.5–15.5)
WBC: 20.8 10*3/uL — ABNORMAL HIGH (ref 4.0–10.5)
nRBC: 0 % (ref 0.0–0.2)

## 2022-10-28 LAB — TROPONIN I (HIGH SENSITIVITY): Troponin I (High Sensitivity): 150 ng/L (ref <18)

## 2022-10-28 MED ORDER — IOHEXOL 350 MG/ML SOLN
75.0000 mL | Freq: Once | INTRAVENOUS | Status: AC | PRN
  Administered 2022-10-28: 75 mL via INTRAVENOUS

## 2022-10-28 MED ORDER — ADENOSINE 6 MG/2ML IV SOLN
6.0000 mg | Freq: Once | INTRAVENOUS | Status: AC
  Administered 2022-10-28: 6 mg via INTRAVENOUS

## 2022-10-28 NOTE — ED Provider Notes (Incomplete)
Patient presenting today with complaints of palpitations, shortness of breath, chest pain with a prior history of SVT requiring cardioversion with adenosine.  Patient's heart rate is 200 and consistent with SVT today.  Vagal maneuvers were unsuccessful.  Patient was cardioverted with 6 of adenosine.  He is now in a sinus rhythm however was hypotensive at first but did improve with fluids.  Sats are 100% on room air.  However chest x-ray with significant infiltrate over the right lobes and radiology was concern for possible pneumothorax.  Patient's troponin is 150 but suspect that is from a leak from his rapid heart rate for hours.  White count of 20,000 of unknown etiology.  CT to further evaluate is pending.

## 2022-10-28 NOTE — ED Provider Notes (Signed)
  [facility] [facility] Provider Note   CSN: [id number] Arrival date & time: 10/28/22  2107     History {Add pertinent medical, surgical, social history, OB history to HPI:1} Chief Complaint  Patient presents with   Palpitations    Blake Ortiz is a 32 y.o. male.   Palpitations      Home Medications Prior to Admission medications   Medication Sig Start Date End Date Taking? Authorizing Provider  metoprolol tartrate (LOPRESSOR) 25 MG tablet TAKE 0.5-1 TABLETS (12.5-25 MG TOTAL) BY MOUTH 3 (THREE) TIMES DAILY AS NEEDED (PALPITATIONS). 03/08/22   Sheilah Pigeon, PA-C      Allergies    Patient has no known allergies.    Review of Systems   Review of Systems  Cardiovascular:  Positive for palpitations.    Physical Exam Updated Vital Signs BP 96/64 (BP Location: Right Arm)   Pulse (!) 206   Temp 98.7 F (37.1 C)   Resp 20   SpO2 (!) 89%  Physical Exam  ED Results / Procedures / Treatments   Labs (all labs ordered are listed, but only abnormal results are displayed) Labs Reviewed  BASIC METABOLIC PANEL  CBC  TROPONIN I (HIGH SENSITIVITY)    EKG None  Radiology No results found.  Procedures Procedures  {Document cardiac monitor, telemetry assessment procedure when appropriate:1}  Medications Ordered in ED Medications - No data to display  ED Course/ Medical Decision Making/ A&P   {   Click here for ABCD2, HEART and other calculatorsREFRESH Note before signing :1}                              Medical Decision Making Amount and/or Complexity of Data Reviewed Labs: ordered. Radiology: ordered.   ***  {Document critical care time when appropriate:1} {Document review of labs and clinical decision tools ie heart score, Chads2Vasc2 etc:1}  {Document your independent review of radiology images, and any outside records:1} {Document your discussion with family members, caretakers, and with consultants:1} {Document  social determinants of health affecting pt's care:1} {Document your decision making why or why not admission, treatments were needed:1} Final Clinical Impression(s) / ED Diagnoses Final diagnoses:  None    Rx / DC Orders ED Discharge Orders     None

## 2022-10-28 NOTE — ED Notes (Signed)
Pt SpO2 dropped to 85%, patient placed on 4L [place]. DO and MD at bedside.

## 2022-10-28 NOTE — ED Triage Notes (Signed)
Pt states that he has been having palpations for the past few hours, has metoprolol prescribed for it but it is not helping

## 2022-10-29 LAB — TROPONIN I (HIGH SENSITIVITY): Troponin I (High Sensitivity): 421 ng/L (ref <18)

## 2022-10-29 MED ORDER — CEFDINIR 300 MG PO CAPS: 300.0000 mg | ORAL_CAPSULE | Freq: Two times a day (BID) | ORAL | 0 refills | Status: AC

## 2022-10-29 MED ORDER — SODIUM CHLORIDE 0.9 % IV SOLN
1.0000 g | Freq: Once | INTRAVENOUS | Status: AC
  Administered 2022-10-29: 1 g via INTRAVENOUS
  Filled 2022-10-29: qty 10

## 2022-10-29 MED ORDER — AZITHROMYCIN 250 MG PO TABS: ORAL_TABLET | ORAL | 0 refills | Status: AC

## 2022-10-29 NOTE — Discharge Instructions (Addendum)
You were seen today for racing heart.  You were in SVT.  Your imaging did show signs of pneumonia.  You got a dose of antibiotics in the emergency department today and I have sent a prescription to your pharmacy.  Take the Omnicef and azithromycin medications as directed.  Please make an appointment to see your primary care doctor within the next week.  Please return to the emergency department for new or worsening shortness of breath or chest pain.

## 2022-10-29 NOTE — ED Notes (Signed)
Ambulated pt in hallway with sp02, oxygen remained above 95. Pt had steady gait, stated he felt normal and had no complaints

## 2022-12-13 DIAGNOSIS — M25562 Pain in left knee: Secondary | ICD-10-CM | POA: Diagnosis not present

## 2022-12-13 DIAGNOSIS — Z043 Encounter for examination and observation following other accident: Secondary | ICD-10-CM | POA: Diagnosis not present

## 2022-12-13 DIAGNOSIS — W19XXXD Unspecified fall, subsequent encounter: Secondary | ICD-10-CM | POA: Diagnosis not present

## 2022-12-13 DIAGNOSIS — R0789 Other chest pain: Secondary | ICD-10-CM | POA: Diagnosis not present
